# Patient Record
Sex: Female | Born: 1945 | Race: Black or African American | Hispanic: No | Marital: Married | Smoking: Never smoker
Health system: Southern US, Community
[De-identification: ages and names within clinical notes are randomized; demographics above are authoritative.]

## PROBLEM LIST (undated history)

## (undated) ENCOUNTER — Inpatient Hospital Stay: Admission: EM | Payer: Medicare HMO | Source: Home / Self Care

## (undated) DIAGNOSIS — M1990 Unspecified osteoarthritis, unspecified site: Secondary | ICD-10-CM

## (undated) DIAGNOSIS — Z860100 Personal history of colon polyps, unspecified: Secondary | ICD-10-CM

## (undated) DIAGNOSIS — K219 Gastro-esophageal reflux disease without esophagitis: Secondary | ICD-10-CM

## (undated) DIAGNOSIS — I10 Essential (primary) hypertension: Secondary | ICD-10-CM

## (undated) DIAGNOSIS — M2550 Pain in unspecified joint: Secondary | ICD-10-CM

## (undated) DIAGNOSIS — E785 Hyperlipidemia, unspecified: Secondary | ICD-10-CM

## (undated) HISTORY — PX: ABDOMINAL HYSTERECTOMY: SHX81

## (undated) HISTORY — PX: OTHER SURGICAL HISTORY: SHX169

## (undated) HISTORY — PX: JOINT REPLACEMENT: SHX530

## (undated) HISTORY — PX: TOTAL HIP ARTHROPLASTY: SHX124

## (undated) HISTORY — PX: TOTAL SHOULDER ARTHROPLASTY: SHX126

---

## 1997-11-03 ENCOUNTER — Emergency Department (HOSPITAL_COMMUNITY): Admission: EM | Admit: 1997-11-03 | Discharge: 1997-11-03 | Payer: Self-pay

## 1997-11-03 ENCOUNTER — Encounter: Payer: Self-pay | Admitting: Emergency Medicine

## 1998-10-24 ENCOUNTER — Encounter: Admission: RE | Admit: 1998-10-24 | Discharge: 1998-10-24 | Payer: Self-pay | Admitting: Orthopedic Surgery

## 1998-12-04 ENCOUNTER — Inpatient Hospital Stay (HOSPITAL_COMMUNITY): Admission: RE | Admit: 1998-12-04 | Discharge: 1998-12-12 | Payer: Self-pay | Admitting: Orthopedic Surgery

## 1998-12-04 ENCOUNTER — Encounter: Payer: Self-pay | Admitting: Orthopedic Surgery

## 1998-12-07 ENCOUNTER — Encounter: Payer: Self-pay | Admitting: Internal Medicine

## 1999-03-20 ENCOUNTER — Ambulatory Visit (HOSPITAL_COMMUNITY): Admission: RE | Admit: 1999-03-20 | Discharge: 1999-03-20 | Payer: Self-pay | Admitting: Orthopedic Surgery

## 1999-03-20 ENCOUNTER — Encounter: Payer: Self-pay | Admitting: Orthopedic Surgery

## 2002-10-02 ENCOUNTER — Ambulatory Visit (HOSPITAL_COMMUNITY): Admission: RE | Admit: 2002-10-02 | Discharge: 2002-10-02 | Payer: Self-pay | Admitting: Family Medicine

## 2002-10-02 ENCOUNTER — Encounter: Payer: Self-pay | Admitting: Family Medicine

## 2003-10-04 ENCOUNTER — Ambulatory Visit (HOSPITAL_COMMUNITY): Admission: RE | Admit: 2003-10-04 | Discharge: 2003-10-04 | Payer: Self-pay | Admitting: Family Medicine

## 2004-01-07 ENCOUNTER — Emergency Department (HOSPITAL_COMMUNITY): Admission: EM | Admit: 2004-01-07 | Discharge: 2004-01-07 | Payer: Self-pay | Admitting: Family Medicine

## 2004-01-19 ENCOUNTER — Emergency Department (HOSPITAL_COMMUNITY): Admission: EM | Admit: 2004-01-19 | Discharge: 2004-01-19 | Payer: Self-pay | Admitting: Emergency Medicine

## 2004-01-21 ENCOUNTER — Emergency Department (HOSPITAL_COMMUNITY): Admission: EM | Admit: 2004-01-21 | Discharge: 2004-01-21 | Payer: Self-pay | Admitting: Family Medicine

## 2004-01-28 ENCOUNTER — Encounter: Admission: RE | Admit: 2004-01-28 | Discharge: 2004-01-28 | Payer: Self-pay | Admitting: Orthopedic Surgery

## 2004-02-01 ENCOUNTER — Encounter: Admission: RE | Admit: 2004-02-01 | Discharge: 2004-02-01 | Payer: Self-pay | Admitting: Orthopedic Surgery

## 2004-02-08 ENCOUNTER — Encounter (INDEPENDENT_AMBULATORY_CARE_PROVIDER_SITE_OTHER): Payer: Self-pay | Admitting: *Deleted

## 2004-02-08 ENCOUNTER — Inpatient Hospital Stay (HOSPITAL_COMMUNITY): Admission: RE | Admit: 2004-02-08 | Discharge: 2004-02-12 | Payer: Self-pay | Admitting: Orthopedic Surgery

## 2004-12-01 ENCOUNTER — Ambulatory Visit (HOSPITAL_COMMUNITY): Admission: RE | Admit: 2004-12-01 | Discharge: 2004-12-01 | Payer: Self-pay | Admitting: Family Medicine

## 2005-08-25 ENCOUNTER — Encounter: Admission: RE | Admit: 2005-08-25 | Discharge: 2005-08-25 | Payer: Self-pay | Admitting: Orthopedic Surgery

## 2005-12-15 ENCOUNTER — Ambulatory Visit (HOSPITAL_COMMUNITY): Admission: RE | Admit: 2005-12-15 | Discharge: 2005-12-15 | Payer: Self-pay | Admitting: Family Medicine

## 2006-02-27 ENCOUNTER — Encounter: Admission: RE | Admit: 2006-02-27 | Discharge: 2006-02-27 | Payer: Self-pay | Admitting: Orthopedic Surgery

## 2007-10-14 ENCOUNTER — Ambulatory Visit (HOSPITAL_COMMUNITY): Admission: RE | Admit: 2007-10-14 | Discharge: 2007-10-14 | Payer: Self-pay | Admitting: Family Medicine

## 2009-03-06 ENCOUNTER — Ambulatory Visit (HOSPITAL_COMMUNITY): Admission: RE | Admit: 2009-03-06 | Discharge: 2009-03-06 | Payer: Self-pay | Admitting: Family Medicine

## 2010-05-30 NOTE — Op Note (Signed)
NAME:  Rhonda Hart, Rhonda Hart NO.:  [id number]   MEDICAL RECORD NO.:  [id number]          PATIENT TYPE:  INP   LOCATION:  NA                           FACILITY:  [facility]   PHYSICIAN:  Thera Flake., M.D.DATE OF BIRTH:  [birth year]   DATE OF PROCEDURE:  02/08/2004  DATE OF DISCHARGE:                                 OPERATIVE REPORT   INDICATION:  This is a 65 year old who has a history of bilateral total  hips, with the right being put in approximately 10 years ago.  She had done  well and actually was lost to followup and presented to the office with  acute hip pain.  Sedimentation rate was in the 50s, but aspiration was  negative, bone scan negative.  X-rays revealed extensive poly wear of the  acetabulum, consistent with early polyethylene loss and she was counseled  that this would require acetabular revision with cup exchange and a  placement of a new femoral head.   PREOPERATIVE DIAGNOSIS:  Polyethylene wear, right acetabulum.   POSTOPERATIVE DIAGNOSIS:  Polyethylene wear, right acetabulum.   OPERATION:  Right acetabular revision with 50-mm Marathon cup with new 28-mm  hip ball with +0 neck length.   SURGEON:  Dyke Brackett, M.D.   ASSISTANT:  Jamelle Rushing, P.A.   BLOOD LOSS:  Approximately 150.   DESCRIPTION OF PROCEDURE:  After sterile prep and drape in lateral position,  a posterior approach to the hip made with careful protection of the sciatic  nerve.  Dissection was carried on up from the anterior aspect of the  acetabular quadrant, relative to being really straight superior, with care  being made not to go posteriorly.  There was a large amount of reaction, but  frozen section revealed less than 5 white cells per high-power field with no  organisms seen.  Capsulectomy was carried out to allow exposure of the hip,  particularly the acetabulum.  The cup was removed with the cup extractor and  the locking ring removed.  A Sector cup was firmly porous  ingrown.  Copious  irrigation was carried out.  Cultures were sent as well from the fluid  around the hip, which was not excessive.  Again, capsulectomy carried out  followed by placement of the Marathon cup.  Really, the hip could not be  dislocated at any position tested, although had a tendency at the most  extreme angle of flexion, internal rotation and adduction to subluxate  slightly, but this was deemed to be an excellent amount of stability with  good maintenance of the leg lengths relatively to a +0 neck length being  removed and a +0 placed back on a 28-mm hip ball.  Again, the femur appeared  solidly ingrown as well.  Closure was effected with #1 Ethibond on the  fascia, which had been split, 2-0 Vicryl, skin clips, Marcaine with  epinephrine in the wound, lightly compressive sterile dressing applied.    WDC/MEDQ  D:  02/08/2004  T:  02/08/2004  Job:  [id number]

## 2010-05-30 NOTE — Op Note (Signed)
[place]  Patient:    Rhonda Hart                    MRN: [id number] Proc. Date: 12/04/98 Adm. Date:  [id number] Attending:  Teena Dunk                           Operative Report  PREOPERATIVE DIAGNOSIS:  Vascular necrosis left hip.  POSTOPERATIVE DIAGNOSIS:  Vasculare necrosis left hip.  OPERATION:  Left total hip replacement (Depuy AML 15 mm stem with 28 mm +1.5 mm  neck with 50 mm dual-lock Enduron acetabulum).  SURGEON:  Sharlot Gowda., M.D.  ASSISTANTS: 1. Jerolyn Shin. Tresa Res, M.D. 2. Arnoldo Morale, P.A.  ANESTHESIA:  General.  ESTIMATED BLOOD LOSS:  Blood loss approximately 300 cc.  DESCRIPTION OF PROCEDURE:  The patient was sterilely prepped and draped in the lateral position after Foley catheter insertion.  The hip was approached with a  posterior approach to the hip.  Careful preservation of the soft tissue. Careful protection of the sciatic nerve.  The gluteus maximus fascia and the iliotibial band was split.  Retractors were placed followed by posterior approach to the hip with a screwing in of the screwed external rotator and tagging it at the piriformis muscle.  Capsulotomy was carried out as well.  Head was delivered out the acetabular.  Progressive reaming carried up to 14.5 mm to accept the 15 mm prosthesis, followed by cutting of the head about one fingerbreadth above the lesser trochanter.  Progressive rasping up to the 15 mm diameter rasp followed by removal of the rasp. Acetabulum was further exposed and one acetabular retractor was placed anteriorly and posteriorly.  Acetabulum was freed with a significant amount of debride.  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  The acetabulum was progressively reamed up about 45 to 50 degrees of adduction, 10 to 15 degrees of anteversion, to 49 mm to accept a 50 mm cup.  Trial cup  48 bottomed out and the 50 would not easily bottom out.  The final cup was inserted in the same position followed by insertion of the trial liner.  The prosthesis was  sized using the broach and Prodigy-type stem versus an AML and it was felt like the AML was optimal in terms that there was no tendency of anterior translocation which was present with the Prodigy and the hip was still extremely stable in flexion nd abduction internal rotation and essentially could not be dislocated even with the soft tissues opened.  The final prosthesis was inserted on the femur and the acetabulum.  An apex hole eliminator screw was placed as well.  Prior to this, the bony surfaces were irrigated.  The hip again was judged to be stable.  Closure was effected with a 2 Ethibond on the capsule.  A capsulotomy had been made retagging the piriformis nd a running #1 Vicryl, 2-0 Vicryl, and skin clips.  Marcaine with epinephrine in he skin.  Lightly compressive sterile dressing applied and knee immobilizer.  Taken to the recovery room in stable condition. DD:  12/04/98 TD:  12/06/98 Job: [id number]

## 2010-05-30 NOTE — Discharge Summary (Signed)
NAMEMarland Kitchen  SAHORY, NORDLING NO.:  [id number]   MEDICAL RECORD NO.:  [id number]          PATIENT TYPE:  INP   LOCATION:  5029                         FACILITY:  [facility]   PHYSICIAN:  Dyke Brackett, M.D.    DATE OF BIRTH:  1945/08/26   DATE OF ADMISSION:  02/08/2004  DATE OF DISCHARGE:  02/12/2004                                 DISCHARGE SUMMARY   ADMISSION DIAGNOSES:  1.  Right total hip replacement with possible centric wear of polyethylene      cup.  2.  Otherwise healthy female.   DISCHARGE DIAGNOSES:  1.  Status post right total hip arthroplasty revision with polyethylene      exchange and ball replacement.  2.  History of left total 63+-hip replacement in 2000.  3.  Acute blood loss anemia secondary to surgery, asymptomatic.   HISTORY OF PRESENT ILLNESS:  Ms. Dias was a 65 year old female with a  right total hip arthroplasty that was painful.  The patient underwent right  total hip arthroplasty in 1997 and left total hip replacement in 2000, left  hip doing well.  Right hip painful since December 2005.  Pain described as a  sudden onset throbbing pain that is constant in nature.  She gets very mild  relief with pain medications.  The pain does awaken her at night,  The  patient uses no assistive devices.  ESR is high in the 48s, C-RP high 1.5.  Bone scan negative.  Hip aspiration done.  No organism seen.  X-rays of the  right hip show possible centric wear of the polyethylene cup.  Therefore,  patient was hospitalized for polyethylene exchange for revision of the hip.   ALLERGIES:  COUMADIN causes hives.   MEDICATIONS:  Roxicet 5 mg one to two tablets daily.   SURGICAL PROCEDURE:  The patient was taken to the operating room on February 08, 2004, by Dr. Madelon Lips assisted by Jamelle Rushing, P.A.  The patient was  placed under general anesthesia and a right total hip arthroplasty with a  polyethylene exchange and a ball replacement was performed.  The  following  components were removed by his right acetabular revision with a 50 mm  Marathon cup with a new 22 mm hip ball, with a plus 0 neck length.  The  patient tolerated the procedure well and returned to the recovery room in  good stable condition.   CONSULTS:  The following consults were obtained while the patient was  hospitalized:  PT, OT case management.   HOSPITAL COURSE:  On postoperative day #2, the patient developed a  temperature max of 103.0 degrees Fahrenheit.  Otherwise, patient had no  complaints.  Blood cultures were ordered x2.  A UA with a culture and  sensitivity were ordered.  On postoperative day #3, temperature max 101.6,  patient with no complaints.  White count 12,300.  Abdominal exam revealed  mild extension in the left upper quadrant tenderness; therefore, it was felt  patient was possibly constipated.  Dulcolax suppository was ordered.  On  postoperative day #4, the patient's temperature max was 101.7.  White count  had trended down to 8800.  The patient was slightly anemic secondary to  surgery, hemoglobin 9.9, hematocrit 30.  Hypokalemia had resolved and  potassium was 3.7.  Mild hypokalemia.  The patient was otherwise doing very  well with physical therapy and pain well controlled, and wanting to be  discharged to home.  The patient remained afebrile throughout the remainder  of the day and was discharged late in the afternoon.   LABORATORIES:  Routine labs on admission - CBC:  White count 8.8, hemoglobin  12.6, hematocrit 37.8, platelets 284.  Coagulations on admission - PT 12.7,  INR 0.9, PTT 38.  Routine chemistries on admission - all values within  normal limits.  Hepatic enzymes on admission - all values within normal  limits.  UA on admission was negative.  Blood cultures were negative.  Gram  stain right hip showed rare WBCs, predominantly mononuclear.  No organisms  seen.  Anaerobic cultures from the right hip showed rare WBCs present.  Both  PMN  and mononuclear - no organisms seen.  ECG performed on February 06, 2004,  showed normal sinus rhythm with a heart rate of 78 beats per minute.  PR  interval 138 msec, PRT axis __________.  Chest x-ray performed on January 07, 2004 showed right upper lobe pneumonia worse than lower lobe pneumonia.   DISCHARGE INSTRUCTIONS:  Medications:  1.  Lovenox 40 mg subcu daily 10 a.m. for 10 days.  2.  Percocet 5/325 one to two tablets every 4-6 hours p.r.n. pain.   Activity:  Weightbearing as tolerated.  PT/home health with [place].   Diet:  No restrictions.   CONDITION ON DISCHARGE:  The patient was discharged home in good and stable  condition.   WOUND CARE:  The patient is to keep wound clean and dry and change dressing  daily.  May shower in two days if no drainage.  Call office at [phone] if  any signs of infection or temperature greater than 101.5.   FOLLOW-UP:  The patient needs follow-up with Dr. Madelon Lips in approximately 10  days from discharge.  The patient is to call [phone] for an appointment.      GC/MEDQ  D:  04/08/2004  T:  04/08/2004  Job:  [id number]

## 2010-05-30 NOTE — H&P (Signed)
NAME:  Rhonda Hart, Rhonda Hart NO.:  [id number]   MEDICAL RECORD NO.:  [id number]          PATIENT TYPE:  INP   LOCATION:  NA                           FACILITY:  [facility]   PHYSICIAN:  Dyke Brackett, M.D.    DATE OF BIRTH:  [birth year]   DATE OF ADMISSION:  DATE OF DISCHARGE:                                HISTORY & PHYSICAL   DATE OF ADMISSION:  February 08, 2004   CHIEF COMPLAINT:  Painful right total hip arthroplasty.   HISTORY OF PRESENT ILLNESS:  Rhonda Hart is a very pleasant 65 year old  African-American female with a right total hip arthroplasty that is painful.  The patient had a right total hip arthroplasty in 1997 and a left total hip  replacement in 2000.  The left hip is doing very well.  The right hip  painful since December 2005.  The pain described as a sudden onset of  throbbing pain.  The pain is constant with some mild relief with pain  medications.  The pain does awaken her at night.  She uses no assistive  devices.  ESR is high at 48, CERP high at 1.5, bone scan negative.  Hip  aspiration done, no organisms seen.   X-RAYS:  Right hip shows possible eccentric wear of the poly cup.   ALLERGIES:  COUMADIN causes hives.   MEDICATIONS:  Roxicet 5 mg one to two daily.   PAST MEDICAL HISTORY:  The patient denies any cardiac, GI, GU, respiratory,  diabetes.   PAST SURGICAL HISTORY:  Right hip in 1997, left hip replacement in 2000.   The patient denies any complications or receiving any blood products with  either of the above procedures.  The patient smokes a half a pack of  cigarettes a day and has done so for at least 10 years.  She denies any  alcohol.  She is married with two grown children.  Primary care physician is  Dr. Rozanna Box.   The patient lives in a one-story home with two steps to the usual entrance.  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   FAMILY HISTORY:  The patient's mother deceased, age 49 or 29,  had  hypertension and stroke.  Father deceased age 32, had cardiac disease and  diabetes.  She has one living brother who is age 73 and healthy.  She has a  sister who is age 39 and has hypertension.   REVIEW OF SYSTEMS:  Reveals pneumonia 1 month ago.  However, this is  resolved.  She denies any recent cold, coughing, or flu-like symptoms.  She  denies any chest pain, PND, orthopnea.  She denies any GI, GU, hepatologic,  or hematologic conditions.  Review of systems otherwise negative.   PHYSICAL EXAMINATION:  GENERAL:  The patient is a well-developed, well-  nourished female in no acute distress.  The patient ambulates without any  assistive devices.  The patient is awake, alert, and appropriate.  She talks  easily with the examiner.  The patient's height is 5 feet 1 inch, 130  pounds.  VITAL SIGNS:  Temperature is 98.6, blood pressure is 132/80, pulse 74,  respiratory rate 16.  HEENT:  Head is normocephalic, atraumatic, without frontal or maxillary  sinus tenderness to palpation.  Conjunctivae are pink, sclerae are  nonicteric. PERRLA.  EOMs are intact.  There is no visible external ear  deformities.  Left TM is pearly and gray.  Right TM is occluded due to  cerumen.  Nose and nasal septum midline, nasal mucosa is pink and moist  without polyps.  Buccal mucosa pink and moist.  The patient has dentures on  the top and bottom jawline.  Oropharynx is without erythema or exudate,  tongue and uvula midline.  CARDIAC:  Regular rate and rhythm, no murmurs, rubs, or gallops noted.  LUNGS:  Clear to auscultation bilaterally.  No wheezing, rhonchi, or rales.  ABDOMEN:  Soft, nontender, bowel sounds x4 quadrants.  NECK:  Trachea is midline.  No lymphadenopathy.  Cervical spine full range  of motion without pain.  Palpation along the cervical column reveals no  tenderness.  Carotids are 2+ without bruits.  BACK:  Nontender to palpation over the thoracic and lumbar spine.  BREAST, GENITALIA,  URINARY, AND RECTAL:  All deferred at this time.  NEUROLOGIC:  The patient is alert and oriented x3.  Cranial nerves II-XII  are grossly intact.  Deep tendon reflexes knees 1+, symmetric; ankle jerks  are absent symmetrically.  Lower extremity strength testing reveals 5/5  strength against resistance throughout.  MUSCULOSKELETAL:  Upper extremities are symmetric in size and shape.  She  has full range of motion of the shoulders, elbows, wrists, hands.  Radial  pulses are 2+ bilaterally.  Lower extremities bilateral hips:  Right hip  full range of motion without pain.  Left hip flexion beyond 90 degrees  causes pain.  External-internal rotation 20 and 25 degrees respectively with  pain beyond those ranges.  Knees bilaterally:  Full range of motion.  Lower  extremities nonedematous. Dorsal and pedal pulses are 2+ bilaterally.   X-RAYS:  X-rays of the right hip show possible eccentric wear of the poly  cup.   IMPRESSION:  1.  Right total hip replacement pain with possible eccentric wear of poly      cup.  2.  Otherwise healthy female.   PLAN:  The patient is to be admitted to [facility] on February 08, 2003 to undergo a right total hip revision with acetabular revision and poly  exchange.  The patient will undergo all preoperative labs and procedures  prior to surgery.      GC/MEDQ  D:  02/04/2004  T:  02/04/2004  Job:  [id number]

## 2010-07-29 ENCOUNTER — Inpatient Hospital Stay (INDEPENDENT_AMBULATORY_CARE_PROVIDER_SITE_OTHER)
Admission: RE | Admit: 2010-07-29 | Discharge: 2010-07-29 | Disposition: A | Discharge status: Home / Self Care | Payer: 59 | Source: Ambulatory Visit | Attending: Family Medicine | Admitting: Family Medicine

## 2010-07-29 DIAGNOSIS — R002 Palpitations: Secondary | ICD-10-CM

## 2010-08-04 ENCOUNTER — Other Ambulatory Visit (HOSPITAL_COMMUNITY): Payer: Self-pay | Admitting: Cardiology

## 2010-08-20 ENCOUNTER — Ambulatory Visit (HOSPITAL_COMMUNITY)
Admission: RE | Admit: 2010-08-20 | Discharge: 2010-08-20 | Disposition: A | Discharge status: Home / Self Care | Payer: 59 | Source: Ambulatory Visit | Attending: Cardiology | Admitting: Cardiology

## 2010-08-20 ENCOUNTER — Encounter (HOSPITAL_COMMUNITY)
Admission: RE | Admit: 2010-08-20 | Discharge: 2010-08-20 | Disposition: A | Discharge status: Home / Self Care | Payer: 59 | Source: Ambulatory Visit | Attending: Cardiology | Admitting: Cardiology

## 2010-08-20 DIAGNOSIS — R9439 Abnormal result of other cardiovascular function study: Secondary | ICD-10-CM | POA: Insufficient documentation

## 2010-08-20 DIAGNOSIS — Z8249 Family history of ischemic heart disease and other diseases of the circulatory system: Secondary | ICD-10-CM | POA: Insufficient documentation

## 2010-08-20 DIAGNOSIS — R079 Chest pain, unspecified: Secondary | ICD-10-CM | POA: Insufficient documentation

## 2010-08-20 DIAGNOSIS — I10 Essential (primary) hypertension: Secondary | ICD-10-CM | POA: Insufficient documentation

## 2010-08-20 DIAGNOSIS — R011 Cardiac murmur, unspecified: Secondary | ICD-10-CM | POA: Insufficient documentation

## 2010-08-20 DIAGNOSIS — R51 Headache: Secondary | ICD-10-CM | POA: Insufficient documentation

## 2010-08-20 MED ORDER — TECHNETIUM TC 99M TETROFOSMIN IV KIT
10.0000 | PACK | Freq: Once | INTRAVENOUS | Status: AC | PRN
  Administered 2010-08-20: 10 via INTRAVENOUS

## 2010-08-20 MED ORDER — TECHNETIUM TC 99M TETROFOSMIN IV KIT
30.0000 | PACK | Freq: Once | INTRAVENOUS | Status: AC | PRN
  Administered 2010-08-20: 30 via INTRAVENOUS

## 2010-11-06 ENCOUNTER — Other Ambulatory Visit (HOSPITAL_COMMUNITY): Payer: Self-pay | Admitting: Cardiology

## 2010-11-06 DIAGNOSIS — Z1231 Encounter for screening mammogram for malignant neoplasm of breast: Secondary | ICD-10-CM

## 2010-12-02 ENCOUNTER — Ambulatory Visit (HOSPITAL_COMMUNITY)
Admission: RE | Admit: 2010-12-02 | Discharge: 2010-12-02 | Disposition: A | Discharge status: Home / Self Care | Payer: 59 | Source: Ambulatory Visit | Attending: Cardiology | Admitting: Cardiology

## 2010-12-02 DIAGNOSIS — Z1231 Encounter for screening mammogram for malignant neoplasm of breast: Secondary | ICD-10-CM | POA: Insufficient documentation

## 2011-11-10 ENCOUNTER — Other Ambulatory Visit (HOSPITAL_COMMUNITY): Payer: Self-pay | Admitting: Cardiology

## 2011-11-10 DIAGNOSIS — Z1231 Encounter for screening mammogram for malignant neoplasm of breast: Secondary | ICD-10-CM

## 2011-12-03 ENCOUNTER — Ambulatory Visit (HOSPITAL_COMMUNITY)
Admission: RE | Admit: 2011-12-03 | Discharge: 2011-12-03 | Disposition: A | Discharge status: Home / Self Care | Payer: Medicare Other | Source: Ambulatory Visit | Attending: Cardiology | Admitting: Cardiology

## 2011-12-03 DIAGNOSIS — Z1231 Encounter for screening mammogram for malignant neoplasm of breast: Secondary | ICD-10-CM | POA: Insufficient documentation

## 2011-12-15 ENCOUNTER — Ambulatory Visit
Admission: RE | Admit: 2011-12-15 | Discharge: 2011-12-15 | Disposition: A | Discharge status: Home / Self Care | Payer: 59 | Source: Ambulatory Visit | Attending: Cardiovascular Disease | Admitting: Cardiovascular Disease

## 2011-12-15 ENCOUNTER — Other Ambulatory Visit: Payer: Self-pay | Admitting: Cardiovascular Disease

## 2011-12-15 DIAGNOSIS — R05 Cough: Secondary | ICD-10-CM

## 2012-06-25 ENCOUNTER — Emergency Department (INDEPENDENT_AMBULATORY_CARE_PROVIDER_SITE_OTHER)
Admission: EM | Admit: 2012-06-25 | Discharge: 2012-06-25 | Disposition: A | Discharge status: Home / Self Care | Payer: Medicare Other | Source: Home / Self Care

## 2012-06-25 ENCOUNTER — Encounter (HOSPITAL_COMMUNITY): Payer: Self-pay | Admitting: Emergency Medicine

## 2012-06-25 DIAGNOSIS — M109 Gout, unspecified: Secondary | ICD-10-CM

## 2012-06-25 HISTORY — DX: Essential (primary) hypertension: I10

## 2012-06-25 MED ORDER — INDOMETHACIN 25 MG PO CAPS: 25.0000 mg | ORAL_CAPSULE | Freq: Three times a day (TID) | ORAL | Status: DC | PRN

## 2012-06-25 NOTE — ED Provider Notes (Signed)
History     CSN: [id number]  Arrival date & time 06/25/12  1229   First MD Initiated Contact with Patient 06/25/12 1331      Chief Complaint  Patient presents with  . Foot Pain    (Consider location/radiation/quality/duration/timing/severity/associated sxs/prior treatment) HPI Comments: Sudden onset of warmth and swelling and pain in right forefoot on the top.  No injury.  No constiutional sx.  No pain prior to yesterday.  No tx tried.  See medical, social hx.    Patient is a 67 y.o. female presenting with lower extremity pain. The history is provided by the patient.  Foot Pain This is a new problem. The current episode started 12 to 24 hours ago. Pertinent negatives include no chest pain, no abdominal pain, no headaches and no shortness of breath.    Past Medical History  Diagnosis Date  . Hypertension     Past Surgical History  Procedure Laterality Date  . Abdominal hysterectomy      No family history on file.  History  Substance Use Topics  . Smoking status: Current Some Day Smoker  . Smokeless tobacco: Not on file  . Alcohol Use: No    OB History   Grav Para Term Preterm Abortions TAB SAB Ect Mult Living                  Review of Systems  Respiratory: Negative for shortness of breath.   Cardiovascular: Negative for chest pain.  Gastrointestinal: Negative for abdominal pain.  Neurological: Negative for headaches.  All other systems reviewed and are negative.    Allergies  Coumadin  Home Medications   Current Outpatient Rx  Name  Route  Sig  Dispense  Refill  . Furosemide (LASIX PO)   Oral   Take by mouth.         . indomethacin (INDOCIN) 25 MG capsule   Oral   Take 1 capsule (25 mg total) by mouth 3 (three) times daily as needed.   30 capsule   0   . LISINOPRIL PO   Oral   Take by mouth.           BP 149/73  Pulse 71  Temp(Src) 98.4 F (36.9 C) (Oral)  Resp 18  SpO2 100%  Physical Exam  Vitals reviewed. Constitutional:  She is oriented to person, place, and time. She appears well-developed and well-nourished.  HENT:  Head: Normocephalic and atraumatic.  Eyes: Conjunctivae are normal. Pupils are equal, round, and reactive to light.  Neck: Normal range of motion. Neck supple.  Musculoskeletal: Normal range of motion.  Neurological: She is alert and oriented to person, place, and time.  Skin: Skin is warm and dry.  Right dorasal forefoot with erythema and edema and ttp.  Otherwise FROM , distal pulses, sens and reflexes wnl.    ED Course  Procedures (including critical care time)  Labs Reviewed - No data to display No results found.   1. Gout       MDM  See chart for meds and instruction.  Discussed dx and need for f/u and monitoring.  SE and precautions of meds reviewed.        Maryelizabeth Rowan, MD 06/25/12 1353

## 2012-06-25 NOTE — ED Notes (Signed)
Pt c/o right foot/metatarsal pain onset yest... sxs include: swelling, pain that increases w/movement Denies: inj/trauma to site, fevers. Pt ambulated well to exam room... She is alert and oriented w/no signs of acute distress.

## 2013-03-16 ENCOUNTER — Ambulatory Visit
Admission: RE | Admit: 2013-03-16 | Discharge: 2013-03-16 | Disposition: A | Discharge status: Home / Self Care | Payer: Commercial Managed Care - HMO | Source: Ambulatory Visit | Attending: Cardiovascular Disease | Admitting: Cardiovascular Disease

## 2013-03-16 ENCOUNTER — Other Ambulatory Visit: Payer: Self-pay | Admitting: Cardiovascular Disease

## 2013-03-16 DIAGNOSIS — J449 Chronic obstructive pulmonary disease, unspecified: Secondary | ICD-10-CM

## 2013-03-16 DIAGNOSIS — M25519 Pain in unspecified shoulder: Secondary | ICD-10-CM

## 2013-03-26 ENCOUNTER — Emergency Department (HOSPITAL_COMMUNITY)
Admission: EM | Admit: 2013-03-26 | Discharge: 2013-03-26 | Disposition: A | Discharge status: Home / Self Care | Payer: Medicare HMO | Attending: Emergency Medicine | Admitting: Emergency Medicine

## 2013-03-26 ENCOUNTER — Encounter (HOSPITAL_COMMUNITY): Payer: Self-pay | Admitting: Emergency Medicine

## 2013-03-26 ENCOUNTER — Emergency Department (HOSPITAL_COMMUNITY): Payer: Medicare HMO

## 2013-03-26 DIAGNOSIS — I10 Essential (primary) hypertension: Secondary | ICD-10-CM

## 2013-03-26 DIAGNOSIS — F17200 Nicotine dependence, unspecified, uncomplicated: Secondary | ICD-10-CM | POA: Insufficient documentation

## 2013-03-26 DIAGNOSIS — R51 Headache: Secondary | ICD-10-CM | POA: Insufficient documentation

## 2013-03-26 DIAGNOSIS — R55 Syncope and collapse: Secondary | ICD-10-CM | POA: Insufficient documentation

## 2013-03-26 DIAGNOSIS — Z79899 Other long term (current) drug therapy: Secondary | ICD-10-CM | POA: Insufficient documentation

## 2013-03-26 DIAGNOSIS — Z888 Allergy status to other drugs, medicaments and biological substances status: Secondary | ICD-10-CM | POA: Insufficient documentation

## 2013-03-26 DIAGNOSIS — H538 Other visual disturbances: Secondary | ICD-10-CM | POA: Insufficient documentation

## 2013-03-26 DIAGNOSIS — R519 Headache, unspecified: Secondary | ICD-10-CM

## 2013-03-26 DIAGNOSIS — Z7982 Long term (current) use of aspirin: Secondary | ICD-10-CM | POA: Insufficient documentation

## 2013-03-26 LAB — APTT: APTT: 34 s (ref 24–37)

## 2013-03-26 LAB — I-STAT TROPONIN, ED: Troponin i, poc: 0 ng/mL (ref 0.00–0.08)

## 2013-03-26 LAB — COMPREHENSIVE METABOLIC PANEL
ALBUMIN: 3.7 g/dL (ref 3.5–5.2)
ALT: 13 U/L (ref 0–35)
AST: 14 U/L (ref 0–37)
Alkaline Phosphatase: 88 U/L (ref 39–117)
BILIRUBIN TOTAL: 0.3 mg/dL (ref 0.3–1.2)
BUN: 13 mg/dL (ref 6–23)
CHLORIDE: 106 meq/L (ref 96–112)
CO2: 22 meq/L (ref 19–32)
Calcium: 9.2 mg/dL (ref 8.4–10.5)
Creatinine, Ser: 0.67 mg/dL (ref 0.50–1.10)
GFR calc Af Amer: >90 mL/min (ref >90)
GFR, EST NON AFRICAN AMERICAN: 90 mL/min — AB (ref >90)
Glucose, Bld: 86 mg/dL (ref 70–99)
Potassium: 3.7 mEq/L (ref 3.7–5.3)
SODIUM: 142 meq/L (ref 137–147)
Total Protein: 7.4 g/dL (ref 6.0–8.3)

## 2013-03-26 LAB — CBC
HCT: 38.1 % (ref 36.0–46.0)
Hemoglobin: 12.6 g/dL (ref 12.0–15.0)
MCH: 30.1 pg (ref 26.0–34.0)
MCHC: 33.1 g/dL (ref 30.0–36.0)
MCV: 91.1 fL (ref 78.0–100.0)
PLATELETS: 287 10*3/uL (ref 150–400)
RBC: 4.18 MIL/uL (ref 3.87–5.11)
RDW: 13.7 % (ref 11.5–15.5)
WBC: 6.6 10*3/uL (ref 4.0–10.5)

## 2013-03-26 LAB — DIFFERENTIAL
BASOS ABS: 0 10*3/uL (ref 0.0–0.1)
BASOS PCT: 0 % (ref 0–1)
Eosinophils Absolute: 0 10*3/uL (ref 0.0–0.7)
Eosinophils Relative: 1 % (ref 0–5)
LYMPHS PCT: 37 % (ref 12–46)
Lymphs Abs: 2.4 10*3/uL (ref 0.7–4.0)
Monocytes Absolute: 0.4 10*3/uL (ref 0.1–1.0)
Monocytes Relative: 5 % (ref 3–12)
NEUTROS ABS: 3.7 10*3/uL (ref 1.7–7.7)
Neutrophils Relative %: 57 % (ref 43–77)

## 2013-03-26 LAB — CBG MONITORING, ED: GLUCOSE-CAPILLARY: 86 mg/dL (ref 70–99)

## 2013-03-26 LAB — PROTIME-INR
INR: 0.98 (ref 0.00–1.49)
PROTHROMBIN TIME: 12.8 s (ref 11.6–15.2)

## 2013-03-26 NOTE — ED Notes (Addendum)
Per pt sts that she was in church and started to have blurred vision, tingling in her hands and fingers and nausea. sts they checked her BP and was over 200 sys. sts she felt a little blurred vision at home but became worse after going to church. sts some dizziness at church and just didn't feel well.  sts she did take her BP meds this am. No other nero deficits noted.

## 2013-03-26 NOTE — ED Provider Notes (Signed)
CSN: [id number]     Arrival date & time 03/26/13  1212 History   First MD Initiated Contact with Patient 03/26/13 1243     Chief Complaint  Patient presents with  . Hypertension     (Consider location/radiation/quality/duration/timing/severity/associated sxs/prior Treatment) HPI Comments: Patient is a 68 year old female with history of hypertension. She woke this morning not feeling well with a headache and blurry vision. She went to church this morning and became lightheaded and nearly passed out. She had to sit down on the pew for several minutes until her sensation subsided. She never lost consciousness. According to her son, she has not taken her blood pressure medication in several days and she is hypertensive upon presentation. She denies any other complaints such as chest pain or shortness of breath.  Patient is a 68 y.o. female presenting with syncope. The history is provided by the patient.  Loss of Consciousness Episode history:  Single Most recent episode:  Today Timing:  Constant Progression:  Resolved Chronicity:  New Witnessed: yes   Relieved by:  Nothing Worsened by:  Nothing tried Ineffective treatments:  None tried   Past Medical History  Diagnosis Date  . Hypertension    Past Surgical History  Procedure Laterality Date  . Abdominal hysterectomy     History reviewed. No pertinent family history. History  Substance Use Topics  . Smoking status: Current Some Day Smoker  . Smokeless tobacco: Not on file  . Alcohol Use: No   OB History   Grav Para Term Preterm Abortions TAB SAB Ect Mult Living                 Review of Systems  Cardiovascular: Positive for syncope.  All other systems reviewed and are negative.      Allergies  Coumadin  Home Medications   Current Outpatient Rx  Name  Route  Sig  Dispense  Refill  . aspirin EC 81 MG tablet   Oral   Take 81 mg by mouth daily.         . hydrochlorothiazide (MICROZIDE) 12.5 MG capsule    Oral   Take 12.5 mg by mouth daily.         Marland Kitchen KLOR-CON M10 10 MEQ tablet   Oral   Take 10 mEq by mouth daily.         Marland Kitchen lisinopril (PRINIVIL,ZESTRIL) 10 MG tablet   Oral   Take 10 mg by mouth daily.         . metoprolol succinate (TOPROL-XL) 50 MG 24 hr tablet   Oral   Take 50 mg by mouth daily.         Marland Kitchen oxyCODONE (OXY IR/ROXICODONE) 5 MG immediate release tablet   Oral   Take 5 mg by mouth daily as needed for severe pain. For pain          BP 209/89  Pulse 70  Temp(Src) 98.3 F (36.8 C)  Resp 18  Wt 165 lb (74.844 kg)  SpO2 100% Physical Exam  Nursing note and vitals reviewed. Constitutional: She is oriented to person, place, and time. She appears well-developed and well-nourished. No distress.  HENT:  Head: Normocephalic and atraumatic.  Mouth/Throat: Oropharynx is clear and moist.  Eyes: EOM are normal. Pupils are equal, round, and reactive to light.  There is no papilledema on funduscopic examination  Neck: Normal range of motion. Neck supple.  Cardiovascular: Normal rate and regular rhythm.  Exam reveals no gallop and no friction rub.  No murmur heard. Pulmonary/Chest: Effort normal and breath sounds normal. No respiratory distress. She has no wheezes.  Abdominal: Soft. Bowel sounds are normal. She exhibits no distension. There is no tenderness.  Musculoskeletal: Normal range of motion. She exhibits no edema.  Neurological: She is alert and oriented to person, place, and time. No cranial nerve deficit. She exhibits normal muscle tone. Coordination normal.  Skin: Skin is warm and dry. She is not diaphoretic.    ED Course  Procedures (including critical care time) Labs Review Labs Reviewed  PROTIME-INR  APTT  CBC  DIFFERENTIAL  COMPREHENSIVE METABOLIC PANEL  CBG MONITORING, ED  I-STAT TROPOININ, ED   Imaging Review No results found.   EKG Interpretation   Date/Time:  Sunday March 26 2013 13:03:47 EDT Ventricular Rate:  60 PR Interval:   142 QRS Duration: 93 QT Interval:  404 QTC Calculation: 404 R Axis:   15 Text Interpretation:  Sinus rhythm Probable anteroseptal infarct, old  Confirmed by DELOS  MD, Melina Mosteller ([id number]) on 03/26/2013 1:18:02 PM      MDM   Final diagnoses:  None    Patient is a 68 year old female who presents with complaints of headache and blurry vision. She then became near syncopal at church. Workup today reveals unremarkable laboratory studies and head CT which is unremarkable. Her blood pressure was significantly elevated upon presentation. According to her son she has not taken this in several days. I suspect her blood pressure may be the issue and I have advised her to be certain to be compliant with her medications. I've identified no emergent pathology that requires hospitalization or emergent intervention and feel as though she is appropriate for discharge.    Veryl Speak, MD 03/26/13 [phone]

## 2013-03-26 NOTE — Discharge Instructions (Signed)
Continue your blood pressure medications as prescribed.  Return to the ER if your symptoms significantly worsen or change.   Arterial Hypertension Arterial hypertension (high blood pressure) is a condition of elevated pressure in your blood vessels. Hypertension over a long period of time is a risk factor for strokes, heart attacks, and heart failure. It is also the leading cause of kidney (renal) failure.  CAUSES   In Adults -- Over 90% of all hypertension has no known cause. This is called essential or primary hypertension. In the other 10% of people with hypertension, the increase in blood pressure is caused by another disorder. This is called secondary hypertension. Important causes of secondary hypertension are:  Heavy alcohol use.  Obstructive sleep apnea.  Hyperaldosterosim (Conn's syndrome).  Steroid use.  Chronic kidney failure.  Hyperparathyroidism.  Medications.  Renal artery stenosis.  Pheochromocytoma.  Cushing's disease.  Coarctation of the aorta.  Scleroderma renal crisis.  Licorice (in excessive amounts).  Drugs (cocaine, methamphetamine). Your caregiver can explain any items above that apply to you.  In Children -- Secondary hypertension is more common and should always be considered.  Pregnancy -- Few women of childbearing age have high blood pressure. However, up to 10% of them develop hypertension of pregnancy. Generally, this will not harm the woman. It may be a sign of 3 complications of pregnancy: preeclampsia, HELLP syndrome, and eclampsia. Follow up and control with medication is necessary. SYMPTOMS   This condition normally does not produce any noticeable symptoms. It is usually found during a routine exam.  Malignant hypertension is a late problem of high blood pressure. It may have the following symptoms:  Headaches.  Blurred vision.  End-organ damage (this means your kidneys, heart, lungs, and other organs are being  damaged).  Stressful situations can increase the blood pressure. If a person with normal blood pressure has their blood pressure go up while being seen by their caregiver, this is often termed "white coat hypertension." Its importance is not known. It may be related with eventually developing hypertension or complications of hypertension.  Hypertension is often confused with mental tension, stress, and anxiety. DIAGNOSIS  The diagnosis is made by 3 separate blood pressure measurements. They are taken at least 1 week apart from each other. If there is organ damage from hypertension, the diagnosis may be made without repeat measurements. Hypertension is usually identified by having blood pressure readings:  Above 140/90 mmHg measured in both arms, at 3 separate times, over a couple weeks.  Over 130/80 mmHg should be considered a risk factor and may require treatment in patients with diabetes. Blood pressure readings over 120/80 mmHg are called "pre-hypertension" even in non-diabetic patients. To get a true blood pressure measurement, use the following guidelines. Be aware of the factors that can alter blood pressure readings.  Take measurements at least 1 hour after caffeine.  Take measurements 30 minutes after smoking and without any stress. This is another reason to quit smoking  it raises your blood pressure.  Use a proper cuff size. Ask your caregiver if you are not sure about your cuff size.  Most home blood pressure cuffs are automatic. They will measure systolic and diastolic pressures. The systolic pressure is the pressure reading at the start of sounds. Diastolic pressure is the pressure at which the sounds disappear. If you are elderly, measure pressures in multiple postures. Try sitting, lying or standing.  Sit at rest for a minimum of 5 minutes before taking measurements.  You should not  be on any medications like decongestants. These are found in many cold medications.  Record  your blood pressure readings and review them with your caregiver. If you have hypertension:  Your caregiver may do tests to be sure you do not have secondary hypertension (see "causes" above).  Your caregiver may also look for signs of metabolic syndrome. This is also called Syndrome X or Insulin Resistance Syndrome. You may have this syndrome if you have type 2 diabetes, abdominal obesity, and abnormal blood lipids in addition to hypertension.  Your caregiver will take your medical and family history and perform a physical exam.  Diagnostic tests may include blood tests (for glucose, cholesterol, potassium, and kidney function), a urinalysis, or an EKG. Other tests may also be necessary depending on your condition. PREVENTION  There are important lifestyle issues that you can adopt to reduce your chance of developing hypertension:  Maintain a normal weight.  Limit the amount of salt (sodium) in your diet.  Exercise often.  Limit alcohol intake.  Get enough potassium in your diet. Discuss specific advice with your caregiver.  Follow a DASH diet (dietary approaches to stop hypertension). This diet is rich in fruits, vegetables, and low-fat dairy products, and avoids certain fats. PROGNOSIS  Essential hypertension cannot be cured. Lifestyle changes and medical treatment can lower blood pressure and reduce complications. The prognosis of secondary hypertension depends on the underlying cause. Many people whose hypertension is controlled with medicine or lifestyle changes can live a normal, healthy life.  RISKS AND COMPLICATIONS  While high blood pressure alone is not an illness, it often requires treatment due to its short- and long-term effects on many organs. Hypertension increases your risk for:  CVAs or strokes (cerebrovascular accident).  Heart failure due to chronically high blood pressure (hypertensive cardiomyopathy).  Heart attack (myocardial infarction).  Damage to the  retina (hypertensive retinopathy).  Kidney failure (hypertensive nephropathy). Your caregiver can explain list items above that apply to you. Treatment of hypertension can significantly reduce the risk of complications. TREATMENT   For overweight patients, weight loss and regular exercise are recommended. Physical fitness lowers blood pressure.  Mild hypertension is usually treated with diet and exercise. A diet rich in fruits and vegetables, fat-free dairy products, and foods low in fat and salt (sodium) can help lower blood pressure. Decreasing salt intake decreases blood pressure in a 1/3 of people.  Stop smoking if you are a smoker. The steps above are highly effective in reducing blood pressure. While these actions are easy to suggest, they are difficult to achieve. Most patients with moderate or severe hypertension end up requiring medications to bring their blood pressure down to a normal level. There are several classes of medications for treatment. Blood pressure pills (antihypertensives) will lower blood pressure by their different actions. Lowering the blood pressure by 10 mmHg may decrease the risk of complications by as much as 25%. The goal of treatment is effective blood pressure control. This will reduce your risk for complications. Your caregiver will help you determine the best treatment for you according to your lifestyle. What is excellent treatment for one person, may not be for you. HOME CARE INSTRUCTIONS   Do not smoke.  Follow the lifestyle changes outlined in the "Prevention" section.  If you are on medications, follow the directions carefully. Blood pressure medications must be taken as prescribed. Skipping doses reduces their benefit. It also puts you at risk for problems.  Follow up with your caregiver, as directed.  If  you are asked to monitor your blood pressure at home, follow the guidelines in the "Diagnosis" section above. SEEK MEDICAL CARE IF:   You think  you are having medication side effects.  You have recurrent headaches or lightheadedness.  You have swelling in your ankles.  You have trouble with your vision. SEEK IMMEDIATE MEDICAL CARE IF:   You have sudden onset of chest pain or pressure, difficulty breathing, or other symptoms of a heart attack.  You have a severe headache.  You have symptoms of a stroke (such as sudden weakness, difficulty speaking, difficulty walking). MAKE SURE YOU:   Understand these instructions.  Will watch your condition.  Will get help right away if you are not doing well or get worse. Document Released: 12/29/2004 Document Revised: 03/23/2011 Document Reviewed: 07/29/2006 [facility] Patient Information 2014 [place], [place]. Headaches, Frequently Asked Questions MIGRAINE HEADACHES Q: What is migraine? What causes it? How can I treat it? A: Generally, migraine headaches begin as a dull ache. Then they develop into a constant, throbbing, and pulsating pain. You may experience pain at the temples. You may experience pain at the front or back of one or both sides of the head. The pain is usually accompanied by a combination of:  Nausea.  Vomiting.  Sensitivity to light and noise. Some people (about 15%) experience an aura (see below) before an attack. The cause of migraine is believed to be chemical reactions in the brain. Treatment for migraine may include over-the-counter or prescription medications. It may also include self-help techniques. These include relaxation training and biofeedback.  Q: What is an aura? A: About 15% of people with migraine get an "aura". This is a sign of neurological symptoms that occur before a migraine headache. You may see wavy or jagged lines, dots, or flashing lights. You might experience tunnel vision or blind spots in one or both eyes. The aura can include visual or auditory hallucinations (something imagined). It may include disruptions in smell (such as strange odors),  taste or touch. Other symptoms include:  Numbness.  A "pins and needles" sensation.  Difficulty in recalling or speaking the correct word. These neurological events may last as long as 60 minutes. These symptoms will fade as the headache begins. Q: What is a trigger? A: Certain physical or environmental factors can lead to or "trigger" a migraine. These include:  Foods.  Hormonal changes.  Weather.  Stress. It is important to remember that triggers are different for everyone. To help prevent migraine attacks, you need to figure out which triggers affect you. Keep a headache diary. This is a good way to track triggers. The diary will help you talk to your healthcare professional about your condition. Q: Does weather affect migraines? A: Bright sunshine, hot, humid conditions, and drastic changes in barometric pressure may lead to, or "trigger," a migraine attack in some people. But studies have shown that weather does not act as a trigger for everyone with migraines. Q: What is the link between migraine and hormones? A: Hormones start and regulate many of your body's functions. Hormones keep your body in balance within a constantly changing environment. The levels of hormones in your body are unbalanced at times. Examples are during menstruation, pregnancy, or menopause. That can lead to a migraine attack. In fact, about three quarters of all women with migraine report that their attacks are related to the menstrual cycle.  Q: Is there an increased risk of stroke for migraine sufferers? A: The likelihood of a migraine  attack causing a stroke is very remote. That is not to say that migraine sufferers cannot have a stroke associated with their migraines. In persons under age 57, the most common associated factor for stroke is migraine headache. But over the course of a person's normal life span, the occurrence of migraine headache may actually be associated with a reduced risk of dying from  cerebrovascular disease due to stroke.  Q: What are acute medications for migraine? A: Acute medications are used to treat the pain of the headache after it has started. Examples over-the-counter medications, NSAIDs, ergots, and triptans.  Q: What are the triptans? A: Triptans are the newest class of abortive medications. They are specifically targeted to treat migraine. Triptans are vasoconstrictors. They moderate some chemical reactions in the brain. The triptans work on receptors in your brain. Triptans help to restore the balance of a neurotransmitter called serotonin. Fluctuations in levels of serotonin are thought to be a main cause of migraine.  Q: Are over-the-counter medications for migraine effective? A: Over-the-counter, or "OTC," medications may be effective in relieving mild to moderate pain and associated symptoms of migraine. But you should see your caregiver before beginning any treatment regimen for migraine.  Q: What are preventive medications for migraine? A: Preventive medications for migraine are sometimes referred to as "prophylactic" treatments. They are used to reduce the frequency, severity, and length of migraine attacks. Examples of preventive medications include antiepileptic medications, antidepressants, beta-blockers, calcium channel blockers, and NSAIDs (nonsteroidal anti-inflammatory drugs). Q: Why are anticonvulsants used to treat migraine? A: During the past few years, there has been an increased interest in antiepileptic drugs for the prevention of migraine. They are sometimes referred to as "anticonvulsants". Both epilepsy and migraine may be caused by similar reactions in the brain.  Q: Why are antidepressants used to treat migraine? A: Antidepressants are typically used to treat people with depression. They may reduce migraine frequency by regulating chemical levels, such as serotonin, in the brain.  Q: What alternative therapies are used to treat migraine? A: The  term "alternative therapies" is often used to describe treatments considered outside the scope of conventional Western medicine. Examples of alternative therapy include acupuncture, acupressure, and yoga. Another common alternative treatment is herbal therapy. Some herbs are believed to relieve headache pain. Always discuss alternative therapies with your caregiver before proceeding. Some herbal products contain arsenic and other toxins. TENSION HEADACHES Q: What is a tension-type headache? What causes it? How can I treat it? A: Tension-type headaches occur randomly. They are often the result of temporary stress, anxiety, fatigue, or anger. Symptoms include soreness in your temples, a tightening band-like sensation around your head (a "vice-like" ache). Symptoms can also include a pulling feeling, pressure sensations, and contracting head and neck muscles. The headache begins in your forehead, temples, or the back of your head and neck. Treatment for tension-type headache may include over-the-counter or prescription medications. Treatment may also include self-help techniques such as relaxation training and biofeedback. CLUSTER HEADACHES Q: What is a cluster headache? What causes it? How can I treat it? A: Cluster headache gets its name because the attacks come in groups. The pain arrives with little, if any, warning. It is usually on one side of the head. A tearing or bloodshot eye and a runny nose on the same side of the headache may also accompany the pain. Cluster headaches are believed to be caused by chemical reactions in the brain. They have been described as the most severe and intense  of any headache type. Treatment for cluster headache includes prescription medication and oxygen. SINUS HEADACHES Q: What is a sinus headache? What causes it? How can I treat it? A: When a cavity in the bones of the face and skull (a sinus) becomes inflamed, the inflammation will cause localized pain. This condition  is usually the result of an allergic reaction, a tumor, or an infection. If your headache is caused by a sinus blockage, such as an infection, you will probably have a fever. An x-ray will confirm a sinus blockage. Your caregiver's treatment might include antibiotics for the infection, as well as antihistamines or decongestants.  REBOUND HEADACHES Q: What is a rebound headache? What causes it? How can I treat it? A: A pattern of taking acute headache medications too often can lead to a condition known as "rebound headache." A pattern of taking too much headache medication includes taking it more than 2 days per week or in excessive amounts. That means more than the label or a caregiver advises. With rebound headaches, your medications not only stop relieving pain, they actually begin to cause headaches. Doctors treat rebound headache by tapering the medication that is being overused. Sometimes your caregiver will gradually substitute a different type of treatment or medication. Stopping may be a challenge. Regularly overusing a medication increases the potential for serious side effects. Consult a caregiver if you regularly use headache medications more than 2 days per week or more than the label advises. ADDITIONAL QUESTIONS AND ANSWERS Q: What is biofeedback? A: Biofeedback is a self-help treatment. Biofeedback uses special equipment to monitor your body's involuntary physical responses. Biofeedback monitors:  Breathing.  Pulse.  Heart rate.  Temperature.  Muscle tension.  Brain activity. Biofeedback helps you refine and perfect your relaxation exercises. You learn to control the physical responses that are related to stress. Once the technique has been mastered, you do not need the equipment any more. Q: Are headaches hereditary? A: Four out of five (80%) of people that suffer report a family history of migraine. Scientists are not sure if this is genetic or a family predisposition. Despite  the uncertainty, a child has a 50% chance of having migraine if one parent suffers. The child has a 75% chance if both parents suffer.  Q: Can children get headaches? A: By the time they reach high school, most young people have experienced some type of headache. Many safe and effective approaches or medications can prevent a headache from occurring or stop it after it has begun.  Q: What type of doctor should I see to diagnose and treat my headache? A: Start with your primary caregiver. Discuss his or her experience and approach to headaches. Discuss methods of classification, diagnosis, and treatment. Your caregiver may decide to recommend you to a headache specialist, depending upon your symptoms or other physical conditions. Having diabetes, allergies, etc., may require a more comprehensive and inclusive approach to your headache. The National [employer] will provide, upon request, a list of [facility] physician members in your state. Document Released: 03/21/2003 Document Revised: 03/23/2011 Document Reviewed: 08/29/2007 [facility] Patient Information 2014 [place], [place].

## 2013-04-24 ENCOUNTER — Other Ambulatory Visit (HOSPITAL_COMMUNITY): Payer: Self-pay | Admitting: Cardiovascular Disease

## 2013-04-24 DIAGNOSIS — R079 Chest pain, unspecified: Secondary | ICD-10-CM

## 2013-05-19 ENCOUNTER — Other Ambulatory Visit: Payer: Self-pay

## 2013-05-19 ENCOUNTER — Other Ambulatory Visit (HOSPITAL_COMMUNITY): Payer: Medicare HMO | Admitting: Internal Medicine

## 2013-05-19 ENCOUNTER — Encounter (HOSPITAL_COMMUNITY)
Admission: RE | Admit: 2013-05-19 | Discharge: 2013-05-19 | Disposition: A | Discharge status: Home / Self Care | Payer: Medicare HMO | Source: Ambulatory Visit | Attending: Cardiovascular Disease | Admitting: Cardiovascular Disease

## 2013-05-19 DIAGNOSIS — R079 Chest pain, unspecified: Secondary | ICD-10-CM | POA: Insufficient documentation

## 2013-05-19 DIAGNOSIS — Z1231 Encounter for screening mammogram for malignant neoplasm of breast: Secondary | ICD-10-CM

## 2013-05-19 MED ORDER — TECHNETIUM TC 99M SESTAMIBI GENERIC - CARDIOLITE
10.0000 | Freq: Once | INTRAVENOUS | Status: AC | PRN
  Administered 2013-05-19: 10 via INTRAVENOUS

## 2013-05-19 MED ORDER — TECHNETIUM TC 99M SESTAMIBI GENERIC - CARDIOLITE
30.0000 | Freq: Once | INTRAVENOUS | Status: AC | PRN
  Administered 2013-05-19: 30 via INTRAVENOUS

## 2013-05-19 MED ORDER — REGADENOSON 0.4 MG/5ML IV SOLN
0.4000 mg | Freq: Once | INTRAVENOUS | Status: AC
  Administered 2013-05-19: 0.4 mg via INTRAVENOUS

## 2013-05-19 MED ORDER — REGADENOSON 0.4 MG/5ML IV SOLN
INTRAVENOUS | Status: AC
  Filled 2013-05-19: qty 5

## 2013-05-26 ENCOUNTER — Ambulatory Visit (HOSPITAL_COMMUNITY)
Admission: RE | Admit: 2013-05-26 | Discharge: 2013-05-26 | Disposition: A | Discharge status: Home / Self Care | Payer: Medicare HMO | Source: Ambulatory Visit | Attending: Internal Medicine | Admitting: Internal Medicine

## 2013-05-26 DIAGNOSIS — Z1231 Encounter for screening mammogram for malignant neoplasm of breast: Secondary | ICD-10-CM | POA: Diagnosis present

## 2014-01-16 DIAGNOSIS — M1710 Unilateral primary osteoarthritis, unspecified knee: Secondary | ICD-10-CM | POA: Diagnosis not present

## 2014-01-16 DIAGNOSIS — M1990 Unspecified osteoarthritis, unspecified site: Secondary | ICD-10-CM | POA: Diagnosis not present

## 2014-01-16 DIAGNOSIS — M19049 Primary osteoarthritis, unspecified hand: Secondary | ICD-10-CM | POA: Diagnosis not present

## 2014-01-16 DIAGNOSIS — I10 Essential (primary) hypertension: Secondary | ICD-10-CM | POA: Diagnosis not present

## 2014-05-09 DIAGNOSIS — I10 Essential (primary) hypertension: Secondary | ICD-10-CM | POA: Diagnosis not present

## 2014-05-09 DIAGNOSIS — M2550 Pain in unspecified joint: Secondary | ICD-10-CM | POA: Diagnosis not present

## 2014-05-09 DIAGNOSIS — M1710 Unilateral primary osteoarthritis, unspecified knee: Secondary | ICD-10-CM | POA: Diagnosis not present

## 2014-05-09 DIAGNOSIS — R5383 Other fatigue: Secondary | ICD-10-CM | POA: Diagnosis not present

## 2014-05-09 DIAGNOSIS — E539 Vitamin B deficiency, unspecified: Secondary | ICD-10-CM | POA: Diagnosis not present

## 2014-05-09 DIAGNOSIS — M19049 Primary osteoarthritis, unspecified hand: Secondary | ICD-10-CM | POA: Diagnosis not present

## 2014-05-09 DIAGNOSIS — Z79899 Other long term (current) drug therapy: Secondary | ICD-10-CM | POA: Diagnosis not present

## 2014-05-16 DIAGNOSIS — Z79899 Other long term (current) drug therapy: Secondary | ICD-10-CM | POA: Diagnosis not present

## 2014-05-16 DIAGNOSIS — R5383 Other fatigue: Secondary | ICD-10-CM | POA: Diagnosis not present

## 2014-05-16 DIAGNOSIS — M2550 Pain in unspecified joint: Secondary | ICD-10-CM | POA: Diagnosis not present

## 2014-05-23 DIAGNOSIS — Z23 Encounter for immunization: Secondary | ICD-10-CM | POA: Diagnosis not present

## 2014-05-23 DIAGNOSIS — M1710 Unilateral primary osteoarthritis, unspecified knee: Secondary | ICD-10-CM | POA: Diagnosis not present

## 2014-05-23 DIAGNOSIS — I10 Essential (primary) hypertension: Secondary | ICD-10-CM | POA: Diagnosis not present

## 2014-05-23 DIAGNOSIS — Z79899 Other long term (current) drug therapy: Secondary | ICD-10-CM | POA: Diagnosis not present

## 2014-07-02 DIAGNOSIS — K5900 Constipation, unspecified: Secondary | ICD-10-CM | POA: Diagnosis not present

## 2014-07-02 DIAGNOSIS — I10 Essential (primary) hypertension: Secondary | ICD-10-CM | POA: Diagnosis not present

## 2014-07-02 DIAGNOSIS — Z1211 Encounter for screening for malignant neoplasm of colon: Secondary | ICD-10-CM | POA: Diagnosis not present

## 2014-07-26 DIAGNOSIS — K635 Polyp of colon: Secondary | ICD-10-CM | POA: Diagnosis not present

## 2014-07-26 DIAGNOSIS — K6389 Other specified diseases of intestine: Secondary | ICD-10-CM | POA: Diagnosis not present

## 2014-07-26 DIAGNOSIS — D127 Benign neoplasm of rectosigmoid junction: Secondary | ICD-10-CM | POA: Diagnosis not present

## 2014-07-26 DIAGNOSIS — Z1211 Encounter for screening for malignant neoplasm of colon: Secondary | ICD-10-CM | POA: Diagnosis not present

## 2014-08-14 DIAGNOSIS — M1710 Unilateral primary osteoarthritis, unspecified knee: Secondary | ICD-10-CM | POA: Diagnosis not present

## 2014-08-14 DIAGNOSIS — I10 Essential (primary) hypertension: Secondary | ICD-10-CM | POA: Diagnosis not present

## 2014-08-14 DIAGNOSIS — F17290 Nicotine dependence, other tobacco product, uncomplicated: Secondary | ICD-10-CM | POA: Diagnosis not present

## 2014-08-30 DIAGNOSIS — M25552 Pain in left hip: Secondary | ICD-10-CM | POA: Diagnosis not present

## 2014-09-10 ENCOUNTER — Other Ambulatory Visit (HOSPITAL_COMMUNITY): Payer: Self-pay | Admitting: Physician Assistant

## 2014-09-10 DIAGNOSIS — M25452 Effusion, left hip: Secondary | ICD-10-CM | POA: Diagnosis not present

## 2014-09-10 DIAGNOSIS — M25552 Pain in left hip: Secondary | ICD-10-CM | POA: Diagnosis not present

## 2014-09-13 ENCOUNTER — Other Ambulatory Visit (HOSPITAL_COMMUNITY): Payer: Self-pay | Admitting: Orthopedic Surgery

## 2014-09-13 DIAGNOSIS — M25552 Pain in left hip: Secondary | ICD-10-CM

## 2014-09-14 ENCOUNTER — Ambulatory Visit (HOSPITAL_COMMUNITY): Payer: Commercial Managed Care - HMO

## 2014-09-18 ENCOUNTER — Ambulatory Visit (HOSPITAL_COMMUNITY)
Admission: RE | Admit: 2014-09-18 | Discharge: 2014-09-18 | Disposition: A | Discharge status: Home / Self Care | Payer: Commercial Managed Care - HMO | Source: Ambulatory Visit | Attending: Orthopedic Surgery | Admitting: Orthopedic Surgery

## 2014-09-18 ENCOUNTER — Encounter (HOSPITAL_COMMUNITY)
Admission: RE | Admit: 2014-09-18 | Discharge: 2014-09-18 | Disposition: A | Discharge status: Home / Self Care | Payer: Commercial Managed Care - HMO | Source: Ambulatory Visit | Attending: Orthopedic Surgery | Admitting: Orthopedic Surgery

## 2014-09-18 DIAGNOSIS — M25552 Pain in left hip: Secondary | ICD-10-CM | POA: Insufficient documentation

## 2014-09-18 DIAGNOSIS — Z96643 Presence of artificial hip joint, bilateral: Secondary | ICD-10-CM | POA: Insufficient documentation

## 2014-10-02 DIAGNOSIS — M25552 Pain in left hip: Secondary | ICD-10-CM | POA: Diagnosis not present

## 2014-10-11 DIAGNOSIS — M25552 Pain in left hip: Secondary | ICD-10-CM | POA: Diagnosis not present

## 2014-10-24 DIAGNOSIS — Z23 Encounter for immunization: Secondary | ICD-10-CM | POA: Diagnosis not present

## 2014-10-24 DIAGNOSIS — M25552 Pain in left hip: Secondary | ICD-10-CM | POA: Diagnosis not present

## 2014-10-24 DIAGNOSIS — F17290 Nicotine dependence, other tobacco product, uncomplicated: Secondary | ICD-10-CM | POA: Diagnosis not present

## 2014-10-24 DIAGNOSIS — I10 Essential (primary) hypertension: Secondary | ICD-10-CM | POA: Diagnosis not present

## 2014-11-08 ENCOUNTER — Ambulatory Visit: Payer: Self-pay | Admitting: Physician Assistant

## 2014-11-08 DIAGNOSIS — M25552 Pain in left hip: Secondary | ICD-10-CM | POA: Diagnosis not present

## 2014-11-08 NOTE — H&P (Signed)
TOTAL HIP REVISION ADMISSION H&P  Patient is admitted for left revision total hip arthroplasty.  Subjective:  Chief Complaint: left hip pain  HPI: Rhonda Hart, 69 y.o. female, has a history of pain and functional disability in the left hip due to arthritis and patient has failed non-surgical conservative treatments for greater than 12 weeks to include NSAID's and/or analgesics, use of assistive devices and activity modification. The indications for the revision total hip arthroplasty are loosening of one or more components and bearing surface wear leading to  implant or hip misalignment.  Onset of symptoms was gradual starting 2 years ago with gradually worsening course since that time.  Prior procedures on the left hip include arthroplasty.  Patient currently rates pain in the left hip at 10 out of 10 with activity.  There is night pain, worsening of pain with activity and weight bearing, pain that interfers with activities of daily living and pain with passive range of motion. 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  This condition presents safety issues increasing the risk of falls.  there is no current active infection.  There are no active problems to display for this patient.  Past Medical History  Diagnosis Date  . Hypertension     Past Surgical History  Procedure Laterality Date  . Abdominal hysterectomy       (Not in a hospital admission) Allergies  Allergen Reactions  . Coumadin [Warfarin Sodium] Rash    Social History  Substance Use Topics  . Smoking status: Current Some Day Smoker  . Smokeless tobacco: Not on file  . Alcohol Use: No    No family history on file.    Review of Systems  Musculoskeletal: Positive for joint pain.  All other systems reviewed and are negative.   Objective:  Physical Exam  Constitutional: She is oriented to person, place, and time. She appears well-developed and well-nourished. No distress.  HENT:  Head:  Normocephalic and atraumatic.  Nose: Nose normal.  Eyes: Conjunctivae and EOM are normal. Pupils are equal, round, and reactive to light.  Neck: Normal range of motion. Neck supple.  Cardiovascular: Normal rate, regular rhythm, normal heart sounds and intact distal pulses.   Respiratory: Effort normal and breath sounds normal. No respiratory distress. She has no wheezes.  GI: Soft. Bowel sounds are normal. She exhibits no distension. There is no tenderness.  Musculoskeletal:       Left hip: She exhibits decreased range of motion and tenderness.  Lymphadenopathy:    She has no cervical adenopathy.  Neurological: She is alert and oriented to person, place, and time. No cranial nerve deficit.  Skin: Skin is warm and dry. No rash noted. No erythema.  Psychiatric: She has a normal mood and affect. Her behavior is normal.    Vital signs in last 24 hours: @VSRANGES @   Labs:   There is no height on file to calculate BMI.  Imaging Review:  Plain radiographs demonstrate previous total hip degenerative joint disease of the left hip(s). There is evidence of loosening of the polywear.The bone quality appears to be good for age and reported activity level.   Assessment/Plan:  End stage arthritis, left hip(s) with failed previous arthroplasty.  The patient history, physical examination, clinical judgement of the provider and imaging studies are consistent with end stage degenerative joint disease of the left hip(s), previous total hip arthroplasty. Revision total hip arthroplasty is deemed medically necessary. The treatment options including medical management, injection therapy, arthroscopy and  arthroplasty were discussed at length. The risks and benefits of total hip arthroplasty were presented and reviewed. The risks due to aseptic loosening, infection, stiffness, dislocation/subluxation,  thromboembolic complications and other imponderables were discussed.  The patient acknowledged the  explanation, agreed to proceed with the plan and consent was signed. Patient is being admitted for inpatient treatment for surgery, pain control, PT, OT, prophylactic antibiotics, VTE prophylaxis, progressive ambulation and ADL's and discharge planning. The patient is planning to be discharged home with home health services

## 2014-11-20 ENCOUNTER — Encounter (HOSPITAL_COMMUNITY)
Admission: RE | Admit: 2014-11-20 | Discharge: 2014-11-20 | Disposition: A | Discharge status: Home / Self Care | Payer: Commercial Managed Care - HMO | Source: Ambulatory Visit | Attending: Orthopedic Surgery | Admitting: Orthopedic Surgery

## 2014-11-20 ENCOUNTER — Ambulatory Visit (HOSPITAL_COMMUNITY)
Admission: RE | Admit: 2014-11-20 | Discharge: 2014-11-20 | Disposition: A | Discharge status: Home / Self Care | Payer: Commercial Managed Care - HMO | Source: Ambulatory Visit | Attending: Physician Assistant | Admitting: Physician Assistant

## 2014-11-20 ENCOUNTER — Encounter (HOSPITAL_COMMUNITY): Payer: Self-pay

## 2014-11-20 DIAGNOSIS — R001 Bradycardia, unspecified: Secondary | ICD-10-CM | POA: Insufficient documentation

## 2014-11-20 DIAGNOSIS — I10 Essential (primary) hypertension: Secondary | ICD-10-CM | POA: Diagnosis not present

## 2014-11-20 DIAGNOSIS — E785 Hyperlipidemia, unspecified: Secondary | ICD-10-CM | POA: Diagnosis not present

## 2014-11-20 DIAGNOSIS — Z01810 Encounter for preprocedural cardiovascular examination: Secondary | ICD-10-CM | POA: Diagnosis not present

## 2014-11-20 DIAGNOSIS — Z96642 Presence of left artificial hip joint: Secondary | ICD-10-CM

## 2014-11-20 DIAGNOSIS — Z01812 Encounter for preprocedural laboratory examination: Secondary | ICD-10-CM | POA: Insufficient documentation

## 2014-11-20 DIAGNOSIS — Z01818 Encounter for other preprocedural examination: Secondary | ICD-10-CM | POA: Diagnosis not present

## 2014-11-20 HISTORY — DX: Unspecified osteoarthritis, unspecified site: M19.90

## 2014-11-20 HISTORY — DX: Personal history of colonic polyps: Z86.010

## 2014-11-20 HISTORY — DX: Pain in unspecified joint: M25.50

## 2014-11-20 HISTORY — DX: Personal history of colon polyps, unspecified: Z86.0100

## 2014-11-20 HISTORY — DX: Hyperlipidemia, unspecified: E78.5

## 2014-11-20 LAB — URINALYSIS, ROUTINE W REFLEX MICROSCOPIC
BILIRUBIN URINE: NEGATIVE
Glucose, UA: NEGATIVE mg/dL
Ketones, ur: NEGATIVE mg/dL
NITRITE: NEGATIVE
PROTEIN: NEGATIVE mg/dL
SPECIFIC GRAVITY, URINE: 1.011 (ref 1.005–1.030)
UROBILINOGEN UA: 0.2 mg/dL (ref 0.0–1.0)
pH: 6 (ref 5.0–8.0)

## 2014-11-20 LAB — PROTIME-INR
INR: 1.03 (ref 0.00–1.49)
PROTHROMBIN TIME: 13.7 s (ref 11.6–15.2)

## 2014-11-20 LAB — URINE MICROSCOPIC-ADD ON

## 2014-11-20 LAB — CBC WITH DIFFERENTIAL/PLATELET
BASOS ABS: 0 10*3/uL (ref 0.0–0.1)
BASOS PCT: 0 %
Eosinophils Absolute: 0 10*3/uL (ref 0.0–0.7)
Eosinophils Relative: 1 %
HEMATOCRIT: 37.3 % (ref 36.0–46.0)
HEMOGLOBIN: 12.3 g/dL (ref 12.0–15.0)
Lymphocytes Relative: 40 %
Lymphs Abs: 2.6 10*3/uL (ref 0.7–4.0)
MCH: 29.9 pg (ref 26.0–34.0)
MCHC: 33 g/dL (ref 30.0–36.0)
MCV: 90.8 fL (ref 78.0–100.0)
MONO ABS: 0.3 10*3/uL (ref 0.1–1.0)
Monocytes Relative: 5 %
NEUTROS ABS: 3.5 10*3/uL (ref 1.7–7.7)
NEUTROS PCT: 54 %
Platelets: 283 10*3/uL (ref 150–400)
RBC: 4.11 MIL/uL (ref 3.87–5.11)
RDW: 13.4 % (ref 11.5–15.5)
WBC: 6.4 10*3/uL (ref 4.0–10.5)

## 2014-11-20 LAB — COMPREHENSIVE METABOLIC PANEL
ALBUMIN: 3.8 g/dL (ref 3.5–5.0)
ALT: 18 U/L (ref 14–54)
AST: 18 U/L (ref 15–41)
Alkaline Phosphatase: 80 U/L (ref 38–126)
Anion gap: 10 (ref 5–15)
BILIRUBIN TOTAL: 0.5 mg/dL (ref 0.3–1.2)
BUN: 16 mg/dL (ref 6–20)
CALCIUM: 9.8 mg/dL (ref 8.9–10.3)
CHLORIDE: 105 mmol/L (ref 101–111)
CO2: 24 mmol/L (ref 22–32)
Creatinine, Ser: 0.85 mg/dL (ref 0.44–1.00)
GFR calc Af Amer: >60 mL/min (ref >60)
GFR calc non Af Amer: >60 mL/min (ref >60)
GLUCOSE: 102 mg/dL — AB (ref 65–99)
POTASSIUM: 3.9 mmol/L (ref 3.5–5.1)
Sodium: 139 mmol/L (ref 135–145)
TOTAL PROTEIN: 7.8 g/dL (ref 6.5–8.1)

## 2014-11-20 LAB — SURGICAL PCR SCREEN
MRSA, PCR: NEGATIVE
STAPHYLOCOCCUS AUREUS: NEGATIVE

## 2014-11-20 LAB — TYPE AND SCREEN
ABO/RH(D): A POS
ANTIBODY SCREEN: NEGATIVE

## 2014-11-20 LAB — APTT: APTT: 38 s — AB (ref 24–37)

## 2014-11-20 LAB — ABO/RH: ABO/RH(D): A POS

## 2014-11-20 MED ORDER — CHLORHEXIDINE GLUCONATE 4 % EX LIQD: 60.0000 mL | Freq: Once | CUTANEOUS | Status: DC

## 2014-11-20 NOTE — Pre-Procedure Instructions (Signed)
Rhonda Hart  11/20/2014      [facility] - [place] ([place]), [place] [address] [place] ([place]) [place] [address] Phone: [phone] Fax: [phone]    Your procedure is scheduled on Fri, Nov 18 @ 7:30 AM  Report to [facility] Admitting at 5:30 AM  Call this number if you have problems the morning of surgery:  [phone]   Remember:  Do not eat food or drink liquids after midnight.  Take these medicines the morning of surgery with A SIP OF WATER Metoprolol(Toprol)              Stop taking your Aspirin.No Goody's,BC's,Aleve,Ibuprofen,Motrin,Advil,Fish Oil,or any Herbal Medications.    Do not wear jewelry, make-up or nail polish.  Do not wear lotions, powders, or perfumes.  You may wear deodorant.  Do not shave 48 hours prior to surgery.    Do not bring valuables to the hospital.  [facility] is not responsible for any belongings or valuables.  Contacts, dentures or bridgework may not be worn into surgery.  Leave your suitcase in the car.  After surgery it may be brought to your room.  For patients admitted to the hospital, discharge time will be determined by your treatment team.  Patients discharged the day of surgery will not be allowed to drive home.    Special instructions:  [place] - Preparing for Surgery  Before surgery, you can play an important role.  Because skin is not sterile, your skin needs to be as free of germs as possible.  You can reduce the number of germs on you skin by washing with CHG (chlorahexidine gluconate) soap before surgery.  CHG is an antiseptic cleaner which kills germs and bonds with the skin to continue killing germs even after washing.  Please DO NOT use if you have an allergy to CHG or antibacterial soaps.  If your skin becomes reddened/irritated stop using the CHG and inform your nurse when you arrive at Short Stay.  Do not shave (including legs and underarms) for at least 48 hours  prior to the first CHG shower.  You may shave your face.  Please follow these instructions carefully:   1.  Shower with CHG Soap the night before surgery and the                                morning of Surgery.  2.  If you choose to wash your hair, wash your hair first as usual with your       normal shampoo.  3.  After you shampoo, rinse your hair and body thoroughly to remove the                      Shampoo.  4.  Use CHG as you would any other liquid soap.  You can apply chg directly       to the skin and wash gently with scrungie or a clean washcloth.  5.  Apply the CHG Soap to your body ONLY FROM THE NECK DOWN.        Do not use on open wounds or open sores.  Avoid contact with your eyes,       ears, mouth and genitals (private parts).  Wash genitals (private parts)       with your normal soap.  6.  Wash thoroughly, paying special  attention to the area where your surgery        will be performed.  7.  Thoroughly rinse your body with warm water from the neck down.  8.  DO NOT shower/wash with your normal soap after using and rinsing off       the CHG Soap.  9.  Pat yourself dry with a clean towel.            10.  Wear clean pajamas.            11.  Place clean sheets on your bed the night of your first shower and do not        sleep with pets.  Day of Surgery  Do not apply any lotions/deoderants the morning of surgery.  Please wear clean clothes to the hospital/surgery center.  [place] - Preparing for Surgery  Before surgery, you can play an important role.  Because skin is not sterile, your skin needs to be as free of germs as possible.  You can reduce the number of germs on you skin by washing with CHG (chlorahexidine gluconate) soap before surgery.  CHG is an antiseptic cleaner which kills germs and bonds with the skin to continue killing germs even after washing.  Please DO NOT use if you have an allergy to CHG or antibacterial soaps.  If your skin becomes reddened/irritated  stop using the CHG and inform your nurse when you arrive at Short Stay.  Do not shave (including legs and underarms) for at least 48 hours prior to the first CHG shower.  You may shave your face.  Please follow these instructions carefully:   1.  Shower with CHG Soap the night before surgery and the                                morning of Surgery.  2.  If you choose to wash your hair, wash your hair first as usual with your       normal shampoo.  3.  After you shampoo, rinse your hair and body thoroughly to remove the                      Shampoo.  4.  Use CHG as you would any other liquid soap.  You can apply chg directly       to the skin and wash gently with scrungie or a clean washcloth.  5.  Apply the CHG Soap to your body ONLY FROM THE NECK DOWN.        Do not use on open wounds or open sores.  Avoid contact with your eyes,       ears, mouth and genitals (private parts).  Wash genitals (private parts)       with your normal soap.  6.  Wash thoroughly, paying special attention to the area where your surgery        will be performed.  7.  Thoroughly rinse your body with warm water from the neck down.  8.  DO NOT shower/wash with your normal soap after using and rinsing off       the CHG Soap.  9.  Pat yourself dry with a clean towel.            10.  Wear clean pajamas.            11.  Place clean sheets on your  bed the night of your first shower and do not        sleep with pets.  Day of Surgery  Do not apply any lotions/deoderants the morning of surgery.  Please wear clean clothes to the hospital/surgery center.    Please read over the following fact sheets that you were given. Pain Booklet, Coughing and Deep Breathing, Blood Transfusion Information, MRSA Information and Surgical Site Infection Prevention

## 2014-11-20 NOTE — Progress Notes (Addendum)
Cardiologist is Dr.Kadakia with last visit a yr ago-to request   Medical Md is Dr.Elizabeth Ernie Hew  Echo thinks it was done last yr-to request report from Cardiologist  Stress test reports in epic from 2012/2015  Heart cath denies ever having one  EKG denies in past yr  CXR denies in past yr

## 2014-11-21 LAB — URINE CULTURE: Culture: >=100000

## 2014-11-29 MED ORDER — CEFAZOLIN SODIUM-DEXTROSE 2-3 GM-% IV SOLR
2.0000 g | INTRAVENOUS | Status: AC
  Administered 2014-11-30: 2 g via INTRAVENOUS
  Filled 2014-11-29: qty 50

## 2014-11-29 MED ORDER — TRANEXAMIC ACID 1000 MG/10ML IV SOLN
1000.0000 mg | INTRAVENOUS | Status: DC
  Filled 2014-11-29: qty 10

## 2014-11-29 MED ORDER — TRANEXAMIC ACID 1000 MG/10ML IV SOLN
1000.0000 mg | INTRAVENOUS | Status: AC
  Administered 2014-11-30: 1000 mg via INTRAVENOUS
  Filled 2014-11-29: qty 10

## 2014-11-30 ENCOUNTER — Inpatient Hospital Stay (HOSPITAL_COMMUNITY): Payer: Commercial Managed Care - HMO

## 2014-11-30 ENCOUNTER — Encounter (HOSPITAL_COMMUNITY)
Admission: RE | Disposition: A | Discharge status: Home Health | Payer: Self-pay | Source: Ambulatory Visit | Attending: Orthopedic Surgery

## 2014-11-30 ENCOUNTER — Inpatient Hospital Stay (HOSPITAL_COMMUNITY)
Admission: RE | Admit: 2014-11-30 | Discharge: 2014-12-02 | DRG: 467 | Disposition: A | Discharge status: Home Health | Payer: Commercial Managed Care - HMO | Source: Ambulatory Visit | Attending: Orthopedic Surgery | Admitting: Orthopedic Surgery

## 2014-11-30 ENCOUNTER — Inpatient Hospital Stay (HOSPITAL_COMMUNITY): Payer: Commercial Managed Care - HMO | Admitting: Anesthesiology

## 2014-11-30 ENCOUNTER — Encounter (HOSPITAL_COMMUNITY): Payer: Self-pay | Admitting: *Deleted

## 2014-11-30 DIAGNOSIS — E785 Hyperlipidemia, unspecified: Secondary | ICD-10-CM | POA: Diagnosis not present

## 2014-11-30 DIAGNOSIS — T84031A Mechanical loosening of internal left hip prosthetic joint, initial encounter: Secondary | ICD-10-CM | POA: Diagnosis not present

## 2014-11-30 DIAGNOSIS — Z96649 Presence of unspecified artificial hip joint: Secondary | ICD-10-CM

## 2014-11-30 DIAGNOSIS — D62 Acute posthemorrhagic anemia: Secondary | ICD-10-CM | POA: Diagnosis not present

## 2014-11-30 DIAGNOSIS — Z471 Aftercare following joint replacement surgery: Secondary | ICD-10-CM | POA: Diagnosis not present

## 2014-11-30 DIAGNOSIS — M1612 Unilateral primary osteoarthritis, left hip: Secondary | ICD-10-CM | POA: Diagnosis not present

## 2014-11-30 DIAGNOSIS — Y792 Prosthetic and other implants, materials and accessory orthopedic devices associated with adverse incidents: Secondary | ICD-10-CM | POA: Diagnosis present

## 2014-11-30 DIAGNOSIS — Z96642 Presence of left artificial hip joint: Secondary | ICD-10-CM | POA: Diagnosis not present

## 2014-11-30 DIAGNOSIS — F17200 Nicotine dependence, unspecified, uncomplicated: Secondary | ICD-10-CM | POA: Diagnosis not present

## 2014-11-30 DIAGNOSIS — I10 Essential (primary) hypertension: Secondary | ICD-10-CM | POA: Diagnosis not present

## 2014-11-30 DIAGNOSIS — T84039A Mechanical loosening of unspecified internal prosthetic joint, initial encounter: Secondary | ICD-10-CM | POA: Diagnosis not present

## 2014-11-30 DIAGNOSIS — M25552 Pain in left hip: Secondary | ICD-10-CM | POA: Diagnosis present

## 2014-11-30 HISTORY — PX: TOTAL HIP REVISION: SHX763

## 2014-11-30 SURGERY — TOTAL HIP REVISION
Anesthesia: General | Site: Hip | Laterality: Left

## 2014-11-30 MED ORDER — ACETAMINOPHEN 650 MG RE SUPP: 650.0000 mg | Freq: Four times a day (QID) | RECTAL | Status: DC | PRN

## 2014-11-30 MED ORDER — HYDROMORPHONE HCL 1 MG/ML IJ SOLN
1.0000 mg | INTRAMUSCULAR | Status: DC | PRN
  Administered 2014-11-30 – 2014-12-01 (×2): 1 mg via INTRAVENOUS
  Filled 2014-11-30 (×2): qty 1

## 2014-11-30 MED ORDER — BISACODYL 10 MG RE SUPP: 10.0000 mg | Freq: Every day | RECTAL | Status: DC | PRN

## 2014-11-30 MED ORDER — DEXAMETHASONE SODIUM PHOSPHATE 4 MG/ML IJ SOLN
INTRAMUSCULAR | Status: AC
  Filled 2014-11-30: qty 1

## 2014-11-30 MED ORDER — PHENOL 1.4 % MT LIQD: 1.0000 | OROMUCOSAL | Status: DC | PRN

## 2014-11-30 MED ORDER — GLYCOPYRROLATE 0.2 MG/ML IJ SOLN
INTRAMUSCULAR | Status: AC
  Filled 2014-11-30: qty 1

## 2014-11-30 MED ORDER — SUGAMMADEX SODIUM 200 MG/2ML IV SOLN
INTRAVENOUS | Status: AC
  Filled 2014-11-30: qty 2

## 2014-11-30 MED ORDER — ASPIRIN EC 81 MG PO TBEC: 81.0000 mg | DELAYED_RELEASE_TABLET | Freq: Every day | ORAL | Status: DC

## 2014-11-30 MED ORDER — FENTANYL CITRATE (PF) 100 MCG/2ML IJ SOLN
INTRAMUSCULAR | Status: DC | PRN
  Administered 2014-11-30: 50 ug via INTRAVENOUS
  Administered 2014-11-30: 150 ug via INTRAVENOUS
  Administered 2014-11-30: 100 ug via INTRAVENOUS

## 2014-11-30 MED ORDER — SODIUM CHLORIDE 0.9 % IR SOLN
Status: DC | PRN
Start: 2014-11-30 — End: 2014-11-30
  Administered 2014-11-30: 3000 mL

## 2014-11-30 MED ORDER — PROPOFOL 10 MG/ML IV BOLUS
INTRAVENOUS | Status: AC
  Filled 2014-11-30: qty 20

## 2014-11-30 MED ORDER — PROPOFOL 10 MG/ML IV BOLUS
INTRAVENOUS | Status: DC | PRN
  Administered 2014-11-30: 150 mg via INTRAVENOUS

## 2014-11-30 MED ORDER — ACETAMINOPHEN 325 MG PO TABS
650.0000 mg | ORAL_TABLET | Freq: Four times a day (QID) | ORAL | Status: DC | PRN
  Administered 2014-12-01 – 2014-12-02 (×3): 650 mg via ORAL
  Filled 2014-11-30 (×2): qty 2

## 2014-11-30 MED ORDER — MIDAZOLAM HCL 5 MG/5ML IJ SOLN
INTRAMUSCULAR | Status: DC | PRN
  Administered 2014-11-30: 2 mg via INTRAVENOUS

## 2014-11-30 MED ORDER — MIDAZOLAM HCL 2 MG/2ML IJ SOLN
INTRAMUSCULAR | Status: AC
  Filled 2014-11-30: qty 2

## 2014-11-30 MED ORDER — MENTHOL 3 MG MT LOZG
1.0000 | LOZENGE | OROMUCOSAL | Status: DC | PRN
  Filled 2014-11-30: qty 9

## 2014-11-30 MED ORDER — LIDOCAINE HCL (CARDIAC) 20 MG/ML IV SOLN
INTRAVENOUS | Status: AC
  Filled 2014-11-30: qty 15

## 2014-11-30 MED ORDER — APIXABAN 2.5 MG PO TABS
2.5000 mg | ORAL_TABLET | Freq: Two times a day (BID) | ORAL | Status: DC
  Administered 2014-12-01 – 2014-12-02 (×3): 2.5 mg via ORAL
  Filled 2014-11-30 (×3): qty 1

## 2014-11-30 MED ORDER — SUGAMMADEX SODIUM 200 MG/2ML IV SOLN
INTRAVENOUS | Status: DC | PRN
  Administered 2014-11-30: 200 mg via INTRAVENOUS

## 2014-11-30 MED ORDER — ONDANSETRON HCL 4 MG/2ML IJ SOLN: 4.0000 mg | Freq: Four times a day (QID) | INTRAMUSCULAR | Status: DC | PRN

## 2014-11-30 MED ORDER — FLEET ENEMA 7-19 GM/118ML RE ENEM: 1.0000 | ENEMA | Freq: Once | RECTAL | Status: DC | PRN

## 2014-11-30 MED ORDER — ONDANSETRON HCL 4 MG/2ML IJ SOLN
INTRAMUSCULAR | Status: DC | PRN
  Administered 2014-11-30: 4 mg via INTRAVENOUS

## 2014-11-30 MED ORDER — ROCURONIUM BROMIDE 100 MG/10ML IV SOLN
INTRAVENOUS | Status: DC | PRN
  Administered 2014-11-30: 40 mg via INTRAVENOUS

## 2014-11-30 MED ORDER — LIDOCAINE HCL (CARDIAC) 20 MG/ML IV SOLN
INTRAVENOUS | Status: DC | PRN
  Administered 2014-11-30: 100 mg via INTRAVENOUS

## 2014-11-30 MED ORDER — SUCCINYLCHOLINE CHLORIDE 20 MG/ML IJ SOLN
INTRAMUSCULAR | Status: AC
  Filled 2014-11-30: qty 1

## 2014-11-30 MED ORDER — ONDANSETRON HCL 4 MG/2ML IJ SOLN: 4.0000 mg | Freq: Once | INTRAMUSCULAR | Status: DC | PRN

## 2014-11-30 MED ORDER — DOCUSATE SODIUM 100 MG PO CAPS
100.0000 mg | ORAL_CAPSULE | Freq: Two times a day (BID) | ORAL | Status: DC
  Administered 2014-11-30 – 2014-12-02 (×5): 100 mg via ORAL
  Filled 2014-11-30 (×5): qty 1

## 2014-11-30 MED ORDER — METOPROLOL SUCCINATE ER 50 MG PO TB24
50.0000 mg | ORAL_TABLET | Freq: Every day | ORAL | Status: DC
  Administered 2014-12-01 – 2014-12-02 (×2): 50 mg via ORAL
  Filled 2014-11-30 (×2): qty 1

## 2014-11-30 MED ORDER — APIXABAN 2.5 MG PO TABS: 2.5000 mg | ORAL_TABLET | Freq: Two times a day (BID) | ORAL | Status: DC

## 2014-11-30 MED ORDER — LACTATED RINGERS IV SOLN
INTRAVENOUS | Status: DC | PRN
  Administered 2014-11-30 (×2): via INTRAVENOUS

## 2014-11-30 MED ORDER — DEXAMETHASONE SODIUM PHOSPHATE 4 MG/ML IJ SOLN
INTRAMUSCULAR | Status: DC | PRN
  Administered 2014-11-30: 4 mg via INTRAVENOUS

## 2014-11-30 MED ORDER — FENTANYL CITRATE (PF) 250 MCG/5ML IJ SOLN
INTRAMUSCULAR | Status: AC
  Filled 2014-11-30: qty 5

## 2014-11-30 MED ORDER — OXYCODONE HCL 5 MG PO TABS
5.0000 mg | ORAL_TABLET | ORAL | Status: DC | PRN
  Administered 2014-11-30 – 2014-12-02 (×6): 10 mg via ORAL
  Filled 2014-11-30 (×2): qty 2
  Filled 2014-11-30: qty 1
  Filled 2014-11-30 (×4): qty 2

## 2014-11-30 MED ORDER — SODIUM CHLORIDE 0.9 % IV SOLN: INTRAVENOUS | Status: DC

## 2014-11-30 MED ORDER — OXYCODONE-ACETAMINOPHEN 5-325 MG PO TABS: 1.0000 | ORAL_TABLET | ORAL | Status: DC | PRN

## 2014-11-30 MED ORDER — FENTANYL CITRATE (PF) 100 MCG/2ML IJ SOLN
25.0000 ug | INTRAMUSCULAR | Status: DC | PRN
  Administered 2014-11-30 (×2): 50 ug via INTRAVENOUS

## 2014-11-30 MED ORDER — SUCCINYLCHOLINE CHLORIDE 20 MG/ML IJ SOLN
INTRAMUSCULAR | Status: DC | PRN
  Administered 2014-11-30: 100 mg via INTRAVENOUS

## 2014-11-30 MED ORDER — METOCLOPRAMIDE HCL 5 MG PO TABS: 5.0000 mg | ORAL_TABLET | Freq: Three times a day (TID) | ORAL | Status: DC | PRN

## 2014-11-30 MED ORDER — ROCURONIUM BROMIDE 50 MG/5ML IV SOLN
INTRAVENOUS | Status: AC
  Filled 2014-11-30: qty 1

## 2014-11-30 MED ORDER — POLYETHYLENE GLYCOL 3350 17 G PO PACK: 17.0000 g | PACK | Freq: Every day | ORAL | Status: DC | PRN

## 2014-11-30 MED ORDER — HYDROCHLOROTHIAZIDE 12.5 MG PO CAPS
12.5000 mg | ORAL_CAPSULE | Freq: Every day | ORAL | Status: DC
  Administered 2014-11-30 – 2014-12-02 (×3): 12.5 mg via ORAL
  Filled 2014-11-30 (×3): qty 1

## 2014-11-30 MED ORDER — EPHEDRINE SULFATE 50 MG/ML IJ SOLN
INTRAMUSCULAR | Status: AC
  Filled 2014-11-30: qty 1

## 2014-11-30 MED ORDER — EPHEDRINE SULFATE 50 MG/ML IJ SOLN
INTRAMUSCULAR | Status: DC | PRN
  Administered 2014-11-30: 10 mg via INTRAVENOUS
  Administered 2014-11-30: 5 mg via INTRAVENOUS

## 2014-11-30 MED ORDER — FENTANYL CITRATE (PF) 100 MCG/2ML IJ SOLN
INTRAMUSCULAR | Status: AC
  Filled 2014-11-30: qty 2

## 2014-11-30 MED ORDER — STERILE WATER FOR INJECTION IJ SOLN
INTRAMUSCULAR | Status: AC
  Filled 2014-11-30: qty 10

## 2014-11-30 MED ORDER — DIPHENHYDRAMINE HCL 12.5 MG/5ML PO ELIX: 12.5000 mg | ORAL_SOLUTION | ORAL | Status: DC | PRN

## 2014-11-30 MED ORDER — METOCLOPRAMIDE HCL 5 MG/ML IJ SOLN: 5.0000 mg | Freq: Three times a day (TID) | INTRAMUSCULAR | Status: DC | PRN

## 2014-11-30 MED ORDER — CEFAZOLIN SODIUM 1-5 GM-% IV SOLN
1.0000 g | Freq: Four times a day (QID) | INTRAVENOUS | Status: AC
  Administered 2014-11-30 (×2): 1 g via INTRAVENOUS
  Filled 2014-11-30 (×3): qty 50

## 2014-11-30 MED ORDER — ONDANSETRON HCL 4 MG PO TABS: 4.0000 mg | ORAL_TABLET | Freq: Four times a day (QID) | ORAL | Status: DC | PRN

## 2014-11-30 MED ORDER — GLYCOPYRROLATE 0.2 MG/ML IJ SOLN
INTRAMUSCULAR | Status: DC | PRN
  Administered 2014-11-30: 0.2 mg via INTRAVENOUS

## 2014-11-30 SURGICAL SUPPLY — 64 items
BIT DRILL 11X150 CANN QR (BIT) ×3 IMPLANT
BIT DRILL 3.8X25 (BIT) ×2 IMPLANT
BLADE SAW SAG 73X25 THK (BLADE) ×2
BLADE SAW SGTL 73X25 THK (BLADE) ×1 IMPLANT
BONE CANC CHIPS 20CC PCAN1/4 (Bone Implant) ×3 IMPLANT
BRUSH FEMORAL CANAL (MISCELLANEOUS) IMPLANT
CHIPS CANC BONE 20CC PCAN1/4 (Bone Implant) ×1 IMPLANT
COVER SURGICAL LIGHT HANDLE (MISCELLANEOUS) ×3 IMPLANT
DRAPE INCISE IOBAN 66X45 STRL (DRAPES) IMPLANT
DRAPE ORTHO SPLIT 77X108 STRL (DRAPES) ×6
DRAPE SURG ORHT 6 SPLT 77X108 (DRAPES) ×2 IMPLANT
DRAPE U-SHAPE 47X51 STRL (DRAPES) ×3 IMPLANT
DRSG ADAPTIC 3X8 NADH LF (GAUZE/BANDAGES/DRESSINGS) ×3 IMPLANT
DRSG PAD ABDOMINAL 8X10 ST (GAUZE/BANDAGES/DRESSINGS) ×6 IMPLANT
DURAPREP 26ML APPLICATOR (WOUND CARE) ×3 IMPLANT
ELECT BLADE 4.0 EZ CLEAN MEGAD (MISCELLANEOUS) ×3
ELECT CAUTERY BLADE 6.4 (BLADE) ×3 IMPLANT
ELECT REM PT RETURN 9FT ADLT (ELECTROSURGICAL) ×3
ELECTRODE BLDE 4.0 EZ CLN MEGD (MISCELLANEOUS) ×1 IMPLANT
ELECTRODE REM PT RTRN 9FT ADLT (ELECTROSURGICAL) ×1 IMPLANT
ELIMINATOR HOLE APEX DEPUY (Hips) IMPLANT
FACESHIELD WRAPAROUND (MASK) ×6 IMPLANT
GAUZE SPONGE 4X4 12PLY STRL (GAUZE/BANDAGES/DRESSINGS) ×3 IMPLANT
GLOVE BIOGEL PI IND STRL 8 (GLOVE) ×2 IMPLANT
GLOVE BIOGEL PI INDICATOR 8 (GLOVE) ×4
GLOVE ORTHO TXT STRL SZ7.5 (GLOVE) ×6 IMPLANT
GLOVE SURG ORTHO 8.0 STRL STRW (GLOVE) ×6 IMPLANT
GOWN STRL REUS W/ TWL LRG LVL3 (GOWN DISPOSABLE) ×1 IMPLANT
GOWN STRL REUS W/ TWL XL LVL3 (GOWN DISPOSABLE) ×1 IMPLANT
GOWN STRL REUS W/TWL 2XL LVL3 (GOWN DISPOSABLE) ×3 IMPLANT
GOWN STRL REUS W/TWL LRG LVL3 (GOWN DISPOSABLE) ×2
GOWN STRL REUS W/TWL XL LVL3 (GOWN DISPOSABLE) ×2
HANDPIECE INTERPULSE COAX TIP (DISPOSABLE)
HEAD FEMORAL DELTA 28MM 12/14 (Head) ×3 IMPLANT
HOOD PEEL AWAY FACE SHEILD DIS (HOOD) ×3 IMPLANT
IMMOBILIZER KNEE 22 UNIV (SOFTGOODS) ×3 IMPLANT
KIT BASIN OR (CUSTOM PROCEDURE TRAY) ×3 IMPLANT
KIT ROOM TURNOVER OR (KITS) ×3 IMPLANT
LINER ACET 10D LIP 28X50 (Hips) ×3 IMPLANT
MANIFOLD NEPTUNE II (INSTRUMENTS) ×3 IMPLANT
NEEDLE 22X1 1/2 (OR ONLY) (NEEDLE) ×3 IMPLANT
NEEDLE MAYO TROCAR (NEEDLE) IMPLANT
NS IRRIG 1000ML POUR BTL (IV SOLUTION) ×3 IMPLANT
PACK TOTAL JOINT (CUSTOM PROCEDURE TRAY) ×3 IMPLANT
PACK UNIVERSAL I (CUSTOM PROCEDURE TRAY) ×3 IMPLANT
PAD ARMBOARD 7.5X6 YLW CONV (MISCELLANEOUS) ×6 IMPLANT
PRESSURIZER FEMORAL UNIV (MISCELLANEOUS) IMPLANT
RING LOCK ACET OD 50 (Hips) ×3 IMPLANT
SET HNDPC FAN SPRY TIP SCT (DISPOSABLE) IMPLANT
SPONGE GAUZE 4X4 12PLY STER LF (GAUZE/BANDAGES/DRESSINGS) ×3 IMPLANT
STAPLER VISISTAT 35W (STAPLE) ×3 IMPLANT
SUCTION FRAZIER TIP 10 FR DISP (SUCTIONS) ×3 IMPLANT
SUT ETHIBOND 2 V 37 (SUTURE) ×3 IMPLANT
SUT VIC AB 0 CT1 27 (SUTURE) ×3
SUT VIC AB 0 CT1 27XBRD ANBCTR (SUTURE) ×1 IMPLANT
SUT VIC AB 2-0 CT1 27 (SUTURE) ×4
SUT VIC AB 2-0 CT1 TAPERPNT 27 (SUTURE) ×2 IMPLANT
SWAB COLLECTION DEVICE MRSA (MISCELLANEOUS) ×6 IMPLANT
SYR CONTROL 10ML LL (SYRINGE) ×3 IMPLANT
TOWEL OR 17X24 6PK STRL BLUE (TOWEL DISPOSABLE) ×3 IMPLANT
TOWEL OR 17X26 10 PK STRL BLUE (TOWEL DISPOSABLE) ×3 IMPLANT
TOWER CARTRIDGE SMART MIX (DISPOSABLE) IMPLANT
TUBE ANAEROBIC SPECIMEN COL (MISCELLANEOUS) ×6 IMPLANT
WATER STERILE IRR 1000ML POUR (IV SOLUTION) ×3 IMPLANT

## 2014-11-30 NOTE — Anesthesia Procedure Notes (Signed)
Procedure Name: Intubation Date/Time: 11/30/2014 7:38 AM Performed by: Ollen Bowl Pre-anesthesia Checklist: Patient identified, Emergency Drugs available, Suction available, Patient being monitored and Timeout performed Patient Re-evaluated:Patient Re-evaluated prior to inductionOxygen Delivery Method: Circle system utilized and Simple face mask Preoxygenation: Pre-oxygenation with 100% oxygen Intubation Type: IV induction Ventilation: Mask ventilation without difficulty Laryngoscope Size: Miller and 2 Grade View: Grade I Tube type: Oral Tube size: 7.5 mm Number of attempts: 1 Airway Equipment and Method: Patient positioned with wedge pillow and Stylet Placement Confirmation: ETT inserted through vocal cords under direct vision,  positive ETCO2 and breath sounds checked- equal and bilateral Secured at: 22 cm Tube secured with: Tape Dental Injury: Teeth and Oropharynx as per pre-operative assessment

## 2014-11-30 NOTE — Anesthesia Postprocedure Evaluation (Signed)
  Anesthesia Post-op Note  Patient: Rhonda Hart  Procedure(s) Performed: Procedure(s) (LRB): TOTAL LEFT HIP REVISION (Left)  Patient Location: PACU  Anesthesia Type: General  Level of Consciousness: awake and alert   Airway and Oxygen Therapy: Patient Spontanous Breathing  Post-op Pain: mild  Post-op Assessment: Post-op Vital signs reviewed, Patient's Cardiovascular Status Stable, Respiratory Function Stable, Patent Airway and No signs of Nausea or vomiting  Last Vitals:  Filed Vitals:   11/30/14 1056  BP:   Pulse:   Temp: 36.2 C  Resp:     Post-op Vital Signs: stable   Complications: No apparent anesthesia complications

## 2014-11-30 NOTE — Brief Op Note (Signed)
11/30/2014  10:03 AM  PATIENT:  Rhonda Hart  69 y.o. female  PRE-OPERATIVE DIAGNOSIS:  LEFT HIP, PROSTHESIS LOOSENING, OA  POST-OPERATIVE DIAGNOSIS:  LEFT HIP, PROSTHESIS LOOSENING, OA  PROCEDURE:  Procedure(s): TOTAL LEFT HIP REVISION (Left) Poly exchange  SURGEON:  Surgeon(s) and Role:    * Earlie Server, MD - Primary  PHYSICIAN ASSISTANT: Chriss Czar, PA-C  ASSISTANTS:   ANESTHESIA:   general  EBL:  Total I/O In: 1200 [I.V.:1200] Out: 300 [Blood:300]  BLOOD ADMINISTERED:none  DRAINS: none   LOCAL MEDICATIONS USED:  NONE  SPECIMEN:  Source of Specimen:  acetabular fluid culture  DISPOSITION OF SPECIMEN:  micro lab  COUNTS:  YES  TOURNIQUET:  * No tourniquets in log *  DICTATION: .Other Dictation: Dictation Number unknown  PLAN OF CARE: Admit to inpatient   PATIENT DISPOSITION:  PACU - hemodynamically stable.   Delay start of Pharmacological VTE agent (>24hrs) due to surgical blood loss or risk of bleeding: yes

## 2014-11-30 NOTE — Progress Notes (Signed)
Receiving report from [place] at this time

## 2014-11-30 NOTE — Transfer of Care (Signed)
Immediate Anesthesia Transfer of Care Note  Patient: Rhonda Hart  Procedure(s) Performed: Procedure(s): TOTAL LEFT HIP REVISION (Left)  Patient Location: PACU  Anesthesia Type:General  Level of Consciousness: awake and alert   Airway & Oxygen Therapy: Patient Spontanous Breathing and Patient connected to nasal cannula oxygen  Post-op Assessment: Report given to RN, Post -op Vital signs reviewed and stable and Patient moving all extremities X 4  Post vital signs: Reviewed and stable  Last Vitals:  Filed Vitals:   11/30/14 0602  BP: 189/79  Pulse:   Temp:   Resp:     Complications: No apparent anesthesia complications

## 2014-11-30 NOTE — H&P (View-Only) (Signed)
TOTAL HIP REVISION ADMISSION H&P  Patient is admitted for left revision total hip arthroplasty.  Subjective:  Chief Complaint: left hip pain  HPI: Rhonda Hart, 69 y.o. female, has a history of pain and functional disability in the left hip due to arthritis and patient has failed non-surgical conservative treatments for greater than 12 weeks to include NSAID's and/or analgesics, use of assistive devices and activity modification. The indications for the revision total hip arthroplasty are loosening of one or more components and bearing surface wear leading to  implant or hip misalignment.  Onset of symptoms was gradual starting 2 years ago with gradually worsening course since that time.  Prior procedures on the left hip include arthroplasty.  Patient currently rates pain in the left hip at 10 out of 10 with activity.  There is night pain, worsening of pain with activity and weight bearing, pain that interfers with activities of daily living and pain with passive range of motion. 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  This condition presents safety issues increasing the risk of falls.  there is no current active infection.  There are no active problems to display for this patient.  Past Medical History  Diagnosis Date  . Hypertension     Past Surgical History  Procedure Laterality Date  . Abdominal hysterectomy       (Not in a hospital admission) Allergies  Allergen Reactions  . Coumadin [Warfarin Sodium] Rash    Social History  Substance Use Topics  . Smoking status: Current Some Day Smoker  . Smokeless tobacco: Not on file  . Alcohol Use: No    No family history on file.    Review of Systems  Musculoskeletal: Positive for joint pain.  All other systems reviewed and are negative.   Objective:  Physical Exam  Constitutional: She is oriented to person, place, and time. She appears well-developed and well-nourished. No distress.  HENT:  Head:  Normocephalic and atraumatic.  Nose: Nose normal.  Eyes: Conjunctivae and EOM are normal. Pupils are equal, round, and reactive to light.  Neck: Normal range of motion. Neck supple.  Cardiovascular: Normal rate, regular rhythm, normal heart sounds and intact distal pulses.   Respiratory: Effort normal and breath sounds normal. No respiratory distress. She has no wheezes.  GI: Soft. Bowel sounds are normal. She exhibits no distension. There is no tenderness.  Musculoskeletal:       Left hip: She exhibits decreased range of motion and tenderness.  Lymphadenopathy:    She has no cervical adenopathy.  Neurological: She is alert and oriented to person, place, and time. No cranial nerve deficit.  Skin: Skin is warm and dry. No rash noted. No erythema.  Psychiatric: She has a normal mood and affect. Her behavior is normal.    Vital signs in last 24 hours: @VSRANGES@   Labs:   There is no height on file to calculate BMI.  Imaging Review:  Plain radiographs demonstrate previous total hip degenerative joint disease of the left hip(s). There is evidence of loosening of the polywear.The bone quality appears to be good for age and reported activity level.   Assessment/Plan:  End stage arthritis, left hip(s) with failed previous arthroplasty.  The patient history, physical examination, clinical judgement of the provider and imaging studies are consistent with end stage degenerative joint disease of the left hip(s), previous total hip arthroplasty. Revision total hip arthroplasty is deemed medically necessary. The treatment options including medical management, injection therapy, arthroscopy and   arthroplasty were discussed at length. The risks and benefits of total hip arthroplasty were presented and reviewed. The risks due to aseptic loosening, infection, stiffness, dislocation/subluxation,  thromboembolic complications and other imponderables were discussed.  The patient acknowledged the  explanation, agreed to proceed with the plan and consent was signed. Patient is being admitted for inpatient treatment for surgery, pain control, PT, OT, prophylactic antibiotics, VTE prophylaxis, progressive ambulation and ADL's and discharge planning. The patient is planning to be discharged home with home health services 

## 2014-11-30 NOTE — Anesthesia Preprocedure Evaluation (Addendum)
Anesthesia Evaluation  Patient identified by MRN, date of birth, ID band Patient awake    Reviewed: Allergy & Precautions, NPO status , Patient's Chart, lab work & pertinent test results, reviewed documented beta blocker date and time   History of Anesthesia Complications Negative for: history of anesthetic complications  Airway Mallampati: II  TM Distance: >3 FB Neck ROM: Full    Dental  (+) Dental Advisory Given, Upper Dentures, Lower Dentures   Pulmonary neg pulmonary ROS,    Pulmonary exam normal breath sounds clear to auscultation       Cardiovascular Exercise Tolerance: Good hypertension, Pt. on medications and Pt. on home beta blockers (-) angina(-) CAD and (-) Past MI Normal cardiovascular exam Rhythm:Regular Rate:Normal     Neuro/Psych negative neurological ROS  negative psych ROS   GI/Hepatic negative GI ROS, Neg liver ROS,   Endo/Other  negative endocrine ROS  Renal/GU negative Renal ROS     Musculoskeletal  (+) Arthritis , Osteoarthritis,    Abdominal   Peds  Hematology negative hematology ROS (+)   Anesthesia Other Findings Day of surgery medications reviewed with the patient.  Reproductive/Obstetrics negative OB ROS                            Anesthesia Physical Anesthesia Plan  ASA: II  Anesthesia Plan: General   Post-op Pain Management:    Induction: Intravenous  Airway Management Planned: Oral ETT  Additional Equipment:   Intra-op Plan:   Post-operative Plan: Extubation in OR  Informed Consent: I have reviewed the patients History and Physical, chart, labs and discussed the procedure including the risks, benefits and alternatives for the proposed anesthesia with the patient or authorized representative who has indicated his/her understanding and acceptance.   Dental advisory given  Plan Discussed with: CRNA  Anesthesia Plan Comments: (Risks/benefits of  general anesthesia discussed with patient including risk of damage to teeth, lips, gum, and tongue, nausea/vomiting, allergic reactions to medications, and the possibility of heart attack, stroke and death.  All patient questions answered.  Patient wishes to proceed.)        Anesthesia Quick Evaluation

## 2014-11-30 NOTE — Interval H&P Note (Signed)
History and Physical Interval Note:  11/30/2014 7:22 AM  Rhonda Hart  has presented today for surgery, with the diagnosis of LEFT HIP, PROSTHESIS LOOSENING, OA  The various methods of treatment have been discussed with the patient and family. After consideration of risks, benefits and other options for treatment, the patient has consented to  Procedure(s): TOTAL LEFT HIP REVISION (Left) as a surgical intervention .  The patient's history has been reviewed, patient examined, no change in status, stable for surgery.  I have reviewed the patient's chart and labs.  Questions were answered to the patient's satisfaction.     Madiha Bambrick JR,W D

## 2014-11-30 NOTE — Evaluation (Signed)
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 PMHx: HTN, hysterectomy  Clinical Impression  Rhonda Hart is moving well for POD #0 with limited activity secondary to pain. Pt stating she has been through this before but unable to recall precautions. Pt educated for all precautions, transfers, plan and function. Pt with decreased strength, transfers and gait who will benefit from acute therapy to maximize mobility, function and independence to decrease burden of care.     Follow Up Recommendations Home health PT    Equipment Recommendations  None recommended by PT    Recommendations for Other Services       Precautions / Restrictions Precautions Precautions: Posterior Hip;Fall Restrictions Weight Bearing Restrictions: Yes LLE Weight Bearing: Weight bearing as tolerated      Mobility  Bed Mobility Overal bed mobility: Needs Assistance Bed Mobility: Supine to Sit     Supine to sit: Min assist     General bed mobility comments: cues for hand placement and sequence and min assist to move LLE to EOB, increased time  Transfers Overall transfer level: Needs assistance   Transfers: Sit to/from Stand Sit to Stand: Supervision         General transfer comment: cues for hand placement and LLE position  Ambulation/Gait Ambulation/Gait assistance: Min guard Ambulation Distance (Feet): 15 Feet Assistive device: Rolling walker (2 wheeled) Gait Pattern/deviations: Step-to pattern;Trunk flexed   Gait velocity interpretation: Below normal speed for age/gender General Gait Details: cues for posture, position in RW and sequence  Stairs            Wheelchair Mobility    Modified Rankin (Stroke Patients Only)       Balance                                             Pertinent Vitals/Pain Pain Assessment: 0-10 Pain Score: 8  Pain Location: left hip Pain Descriptors / Indicators: Aching;Throbbing Pain Intervention(s): Limited activity within patient's tolerance;Monitored during session;RN gave pain meds during session;Repositioned;Ice applied    Home Living Family/patient expects to be discharged to:: Private residence Living Arrangements: Spouse/significant other Available Help at Discharge: Family;Available 24 hours/day Type of Home: House Home Access: Stairs to enter   [employer] of Steps: 1 to enter then 2 to kitchen Home Layout: One level Home Equipment: Environmental consultant - 2 wheels;Bedside commode      Prior Function Level of Independence: Independent               Hand Dominance        Extremity/Trunk Assessment   Upper Extremity Assessment: Overall WFL for tasks assessed           Lower Extremity Assessment: LLE deficits/detail   LLE Deficits / Details: limited strength and ROM as expected post op  Cervical / Trunk Assessment: Normal  Communication   Communication: No difficulties  Cognition Arousal/Alertness: Awake/alert Behavior During Therapy: WFL for tasks assessed/performed Overall Cognitive Status: Within Functional Limits for tasks assessed                      General Comments      Exercises  Assessment/Plan    PT Assessment Patient needs continued PT services  PT Diagnosis Difficulty walking;Acute pain   PT Problem List Decreased strength;Decreased range of motion;Decreased activity tolerance;Decreased balance;Decreased mobility;Pain;Decreased knowledge of use of DME;Decreased knowledge of precautions  PT Treatment Interventions DME instruction;Gait training;Stair training;Functional mobility training;Therapeutic activities;Therapeutic exercise;Patient/family education   PT Goals (Current goals can be found in the Care Plan section) Acute  Rehab PT Goals Patient Stated Goal: return home and "keep this hip til I die" PT Goal Formulation: With patient Time For Goal Achievement: 12/07/14 Potential to Achieve Goals: Good    Frequency 7X/week   Barriers to discharge        Co-evaluation               End of Session Equipment Utilized During Treatment: Gait belt;Left knee immobilizer Activity Tolerance: Patient tolerated treatment well Patient left: in chair;with call bell/phone within reach;with nursing/sitter in room;with family/visitor present Nurse Communication: Mobility status;Patient requests pain meds;Precautions;Weight bearing status         Time: [id number] PT Time Calculation (min) (ACUTE ONLY): 21 min   Charges:   PT Evaluation $Initial PT Evaluation Tier I: 1 Procedure     PT G CodesMelford Aase 11/30/2014, 2:32 PM Elwyn Reach, [place]

## 2014-11-30 NOTE — Progress Notes (Signed)
Utilization review completed.  

## 2014-12-01 DIAGNOSIS — Z96649 Presence of unspecified artificial hip joint: Secondary | ICD-10-CM

## 2014-12-01 LAB — BASIC METABOLIC PANEL
Anion gap: 10 (ref 5–15)
BUN: 13 mg/dL (ref 6–20)
CHLORIDE: 101 mmol/L (ref 101–111)
CO2: 24 mmol/L (ref 22–32)
CREATININE: 0.76 mg/dL (ref 0.44–1.00)
Calcium: 8.8 mg/dL — ABNORMAL LOW (ref 8.9–10.3)
GFR calc Af Amer: >60 mL/min (ref >60)
GFR calc non Af Amer: >60 mL/min (ref >60)
GLUCOSE: 118 mg/dL — AB (ref 65–99)
POTASSIUM: 3.7 mmol/L (ref 3.5–5.1)
Sodium: 135 mmol/L (ref 135–145)

## 2014-12-01 LAB — CBC
HCT: 34.2 % — ABNORMAL LOW (ref 36.0–46.0)
HEMOGLOBIN: 11.2 g/dL — AB (ref 12.0–15.0)
MCH: 29.8 pg (ref 26.0–34.0)
MCHC: 32.7 g/dL (ref 30.0–36.0)
MCV: 91 fL (ref 78.0–100.0)
Platelets: 302 10*3/uL (ref 150–400)
RBC: 3.76 MIL/uL — AB (ref 3.87–5.11)
RDW: 13.5 % (ref 11.5–15.5)
WBC: 10.4 10*3/uL (ref 4.0–10.5)

## 2014-12-01 MED ORDER — HYDROCODONE-ACETAMINOPHEN 5-325 MG PO TABS
1.0000 | ORAL_TABLET | Freq: Four times a day (QID) | ORAL | Status: DC | PRN
  Administered 2014-12-01 – 2014-12-02 (×3): 2 via ORAL
  Filled 2014-12-01 (×3): qty 2

## 2014-12-01 NOTE — Discharge Instructions (Signed)
INSTRUCTIONS AFTER JOINT REPLACEMENT  ° °o Remove items at home which could result in a fall. This includes throw rugs or furniture in walking pathways °o ICE to the affected joint every three hours while awake for 30 minutes at a time, for at least the first 3-5 days, and then as needed for pain and swelling.  Continue to use ice for pain and swelling. You may notice swelling that will progress down to the foot and ankle.  This is normal after surgery.  Elevate your leg when you are not up walking on it.   °o Continue to use the breathing machine you got in the hospital (incentive spirometer) which will help keep your temperature down.  It is common for your temperature to cycle up and down following surgery, especially at night when you are not up moving around and exerting yourself.  The breathing machine keeps your lungs expanded and your temperature down. ° ° °DIET:  As you were doing prior to hospitalization, we recommend a well-balanced diet. ° °DRESSING / WOUND CARE / SHOWERING ° °You may change your dressing 3-5 days after surgery.  Then change the dressing every day with sterile gauze.  Please use good hand washing techniques before changing the dressing.  Do not use any lotions or creams on the incision until instructed by your surgeon. ° °ACTIVITY ° °o Increase activity slowly as tolerated, but follow the weight bearing instructions below.   °o No driving for 6 weeks or until further direction given by your physician.  You cannot drive while taking narcotics.  °o No lifting or carrying greater than 10 lbs. until further directed by your surgeon. °o Avoid periods of inactivity such as sitting longer than an hour when not asleep. This helps prevent blood clots.  °o You may return to work once you are authorized by your doctor.  ° ° ° °WEIGHT BEARING  ° °Weight bearing as tolerated with assist device (walker, cane, etc) as directed, use it as long as suggested by your surgeon or therapist, typically at  least 4-6 weeks. ° ° °EXERCISES ° °Results after joint replacement surgery are often greatly improved when you follow the exercise, range of motion and muscle strengthening exercises prescribed by your doctor. Safety measures are also important to protect the joint from further injury. Any time any of these exercises cause you to have increased pain or swelling, decrease what you are doing until you are comfortable again and then slowly increase them. If you have problems or questions, call your caregiver or physical therapist for advice.  ° °Rehabilitation is important following a joint replacement. After just a few days of immobilization, the muscles of the leg can become weakened and shrink (atrophy).  These exercises are designed to build up the tone and strength of the thigh and leg muscles and to improve motion. Often times heat used for twenty to thirty minutes before working out will loosen up your tissues and help with improving the range of motion but do not use heat for the first two weeks following surgery (sometimes heat can increase post-operative swelling).  ° °These exercises can be done on a training (exercise) mat, on the floor, on a table or on a bed. Use whatever works the best and is most comfortable for you.    Use music or television while you are exercising so that the exercises are a pleasant break in your day. This will make your life better with the exercises acting as a break   in your routine that you can look forward to.   Perform all exercises about fifteen times, three times per day or as directed.  You should exercise both the operative leg and the other leg as well. ° °Exercises include: °  °• Quad Sets - Tighten up the muscle on the front of the thigh (Quad) and hold for 5-10 seconds.   °• Straight Leg Raises - With your knee straight (if you were given a brace, keep it on), lift the leg to 60 degrees, hold for 3 seconds, and slowly lower the leg.  Perform this exercise against  resistance later as your leg gets stronger.  °• Leg Slides: Lying on your back, slowly slide your foot toward your buttocks, bending your knee up off the floor (only go as far as is comfortable). Then slowly slide your foot back down until your leg is flat on the floor again.  °• Angel Wings: Lying on your back spread your legs to the side as far apart as you can without causing discomfort.  °• Hamstring Strength:  Lying on your back, push your heel against the floor with your leg straight by tightening up the muscles of your buttocks.  Repeat, but this time bend your knee to a comfortable angle, and push your heel against the floor.  You may put a pillow under the heel to make it more comfortable if necessary.  ° °A rehabilitation program following joint replacement surgery can speed recovery and prevent re-injury in the future due to weakened muscles. Contact your doctor or a physical therapist for more information on knee rehabilitation.  ° ° °CONSTIPATION ° °Constipation is defined medically as fewer than three stools per week and severe constipation as less than one stool per week.  Even if you have a regular bowel pattern at home, your normal regimen is likely to be disrupted due to multiple reasons following surgery.  Combination of anesthesia, postoperative narcotics, change in appetite and fluid intake all can affect your bowels.  ° °YOU MUST use at least one of the following options; they are listed in order of increasing strength to get the job done.  They are all available over the counter, and you may need to use some, POSSIBLY even all of these options:   ° °Drink plenty of fluids (prune juice may be helpful) and high fiber foods °Colace 100 mg by mouth twice a day  °Senokot for constipation as directed and as needed Dulcolax (bisacodyl), take with full glass of water  °Miralax (polyethylene glycol) once or twice a day as needed. ° °If you have tried all these things and are unable to have a bowel  movement in the first 3-4 days after surgery call either your surgeon or your primary doctor.   ° °If you experience loose stools or diarrhea, hold the medications until you stool forms back up.  If your symptoms do not get better within 1 week or if they get worse, check with your doctor.  If you experience "the worst abdominal pain ever" or develop nausea or vomiting, please contact the office immediately for further recommendations for treatment. ° ° °ITCHING:  If you experience itching with your medications, try taking only a single pain pill, or even half a pain pill at a time.  You can also use Benadryl over the counter for itching or also to help with sleep.  ° °TED HOSE STOCKINGS:  Use stockings on both legs until for at least 2 weeks or as   directed by physician office. They may be removed at night for sleeping. ° °MEDICATIONS:  See your medication summary on the “After Visit Summary” that nursing will review with you.  You may have some home medications which will be placed on hold until you complete the course of blood thinner medication.  It is important for you to complete the blood thinner medication as prescribed. ° °PRECAUTIONS:  If you experience chest pain or shortness of breath - call 911 immediately for transfer to the hospital emergency department.  ° °If you develop a fever greater that 101 F, purulent drainage from wound, increased redness or drainage from wound, foul odor from the wound/dressing, or calf pain - CONTACT YOUR SURGEON.   °                                                °FOLLOW-UP APPOINTMENTS:  If you do not already have a post-op appointment, please call the office for an appointment to be seen by your surgeon.  Guidelines for how soon to be seen are listed in your “After Visit Summary”, but are typically between 1-4 weeks after surgery. ° °OTHER INSTRUCTIONS:  ° °Knee Replacement:  Do not place pillow under knee, focus on keeping the knee straight while resting. CPM  instructions: 0-90 degrees, 2 hours in the morning, 2 hours in the afternoon, and 2 hours in the evening. Place foam block, curve side up under heel at all times except when in CPM or when walking.  DO NOT modify, tear, cut, or change the foam block in any way. ° °MAKE SURE YOU:  °• Understand these instructions.  °• Get help right away if you are not doing well or get worse.  ° ° °Thank you for letting us be a part of your medical care team.  It is a privilege we respect greatly.  We hope these instructions will help you stay on track for a fast and full recovery!  ° ° ° ° ° °Information on my medicine - ELIQUIS® (apixaban) ° °Why was Eliquis® prescribed for you? °Eliquis® was prescribed for you to reduce the risk of blood clots forming after orthopedic surgery.   ° °What do You need to know about Eliquis®? °Take your Eliquis® TWICE DAILY - one tablet in the morning and one tablet in the evening with or without food.  It would be best to take the dose about the same time each day. ° °If you have difficulty swallowing the tablet whole please discuss with your pharmacist how to take the medication safely. ° °Take Eliquis® exactly as prescribed by your doctor and DO NOT stop taking Eliquis® without talking to the doctor who prescribed the medication.  Stopping without other medication to take the place of Eliquis® may increase your risk of developing a clot. ° °After discharge, you should have regular check-up appointments with your healthcare provider that is prescribing your Eliquis®. ° °What do you do if you miss a dose? °If a dose of ELIQUIS® is not taken at the scheduled time, take it as soon as possible on the same day and twice-daily administration should be resumed.  The dose should not be doubled to make up for a missed dose.  Do not take more than one tablet of ELIQUIS at the same time. ° °Important Safety Information °A possible side effect of Eliquis®   is bleeding. You should call your healthcare provider  right away if you experience any of the following: °? Bleeding from an injury or your nose that does not stop. °? Unusual colored urine (red or dark brown) or unusual colored stools (red or black). °? Unusual bruising for unknown reasons. °? A serious fall or if you hit your head (even if there is no bleeding). ° °Some medicines may interact with Eliquis® and might increase your risk of bleeding or clotting while on Eliquis®. To help avoid this, consult your healthcare provider or pharmacist prior to using any new prescription or non-prescription medications, including herbals, vitamins, non-steroidal anti-inflammatory drugs (NSAIDs) and supplements. ° °This website has more information on Eliquis® (apixaban): http://www.eliquis.com/eliquis/home ° ° °

## 2014-12-01 NOTE — Evaluation (Signed)
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 PMHx: HTN, hysterectomy   Clinical Impression   Pt was independent prior to admission.  Presents with increased L hip pain despite medication, generalized weakness and decreased balance interfering with ability to perform ADL and mobility at her baseline.  Pt disappointed by her decreased tolerance for activity and pain limitations and concerned about plan to go home tomorrow.  Pt educated in hip precautions and practiced use of AE for LB bathing and dressing.  Will follow acutely.    Follow Up Recommendations  Home health OT;Supervision/Assistance - 24 hour    Equipment Recommendations  Tub/shower bench    Recommendations for Other Services       Precautions / Restrictions Precautions Precautions: Posterior Hip;Fall Precaution Booklet Issued: Yes (comment) Precaution Comments: pt able to recall 2/3 hip precautions. reviewed all 3 with cues to adhere to them in session Restrictions Weight Bearing Restrictions: Yes LLE Weight Bearing: Weight bearing as tolerated      Mobility Bed Mobility Overal bed mobility: Needs Assistance Bed Mobility: Supine to Sit;Sit to Supine     Supine to sit: Mod assist Sit to supine: Min assist   General bed mobility comments: assist for L LE and use of pad to advance hips to EOB  Transfers Overall transfer level: Needs assistance Equipment used: Rolling walker (2 wheeled) Transfers: Sit to/from Stand Sit to Stand: Min assist         General transfer comment: from elevated bed, cues for hand placement    Balance Overall balance assessment: Needs assistance Sitting-balance support: Feet supported Sitting balance-Leahy Scale: Fair Sitting balance - Comments:  attempts to unload L LE due to pain   Standing balance support: Bilateral upper extremity supported Standing balance-Leahy Scale: Poor Standing balance comment: dependent on B UE support to stand                            ADL Overall ADL's : Needs assistance/impaired Eating/Feeding: Independent;Sitting   Grooming: Wash/dry hands;Wash/dry face;Sitting;Set up   Upper Body Bathing: Set up;Sitting   Lower Body Bathing: Moderate assistance;Sit to/from stand Lower Body Bathing Details (indicate cue type and reason): instructed in use of long handled bath sponge Upper Body Dressing : Set up;Sitting   Lower Body Dressing: Moderate assistance;Sit to/from stand Lower Body Dressing Details (indicate cue type and reason): practiced use of reacher and sock aide Toilet Transfer: Minimal assistance;Stand-pivot;RW   Toileting- Clothing Manipulation and Hygiene: Minimal assistance;Sit to/from stand         General ADL Comments: Pt limited by pain today.      Vision     Perception     Praxis      Pertinent Vitals/Pain Pain Assessment: 0-10 Pain Score: 7  Pain Location: R hip Pain Descriptors / Indicators: Aching Pain Intervention(s): Limited activity within patient's tolerance;Monitored during session;Premedicated before session;Repositioned;Ice applied     Hand Dominance Right   Extremity/Trunk Assessment Upper Extremity Assessment Upper Extremity Assessment: Overall WFL for tasks assessed   Lower Extremity Assessment Lower Extremity Assessment: Defer to PT evaluation   Cervical / Trunk Assessment Cervical / Trunk Assessment: Normal   Communication Communication Communication: No difficulties   Cognition Arousal/Alertness: Awake/alert Behavior During Therapy: WFL for tasks  assessed/performed Overall Cognitive Status: Within Functional Limits for tasks assessed                     General Comments       Exercises       Shoulder Instructions       Home Living Family/patient expects to be discharged to:: Private residence Living Arrangements: Spouse/significant other Available Help at Discharge: Family;Available 24 hours/day Type of Home: House Home Access: Stairs to enter [employer] of Steps: 1 to enter then 2 to kitchen   Home Layout: One level     Bathroom Shower/Tub: Teacher, early years/pre: Standard     Home Equipment: Environmental consultant - 2 wheels;Bedside commode;Adaptive equipment Adaptive Equipment: Reacher        Prior Functioning/Environment Level of Independence: Independent             OT Diagnosis: Generalized weakness;Acute pain   OT Problem List: Decreased strength;Decreased activity tolerance;Impaired balance (sitting and/or standing);Decreased knowledge of use of DME or AE;Decreased knowledge of precautions;Pain   OT Treatment/Interventions: Self-care/ADL training;DME and/or AE instruction;Balance training;Patient/family education    OT Goals(Current goals can be found in the care plan section) Acute Rehab OT Goals Patient Stated Goal: return home and "keep this hip til I die" OT Goal Formulation: With patient Time For Goal Achievement: 12/08/14 Potential to Achieve Goals: Good ADL Goals Pt Will Perform Grooming: with supervision;standing Pt Will Perform Lower Body Bathing: with supervision;sit to/from stand;with adaptive equipment Pt Will Perform Lower Body Dressing: with supervision;with adaptive equipment;sit to/from stand Pt Will Transfer to Toilet: with supervision;ambulating;bedside commode (over toilet) Pt Will Perform Toileting - Clothing Manipulation and hygiene: with supervision;sit to/from stand Pt Will Perform Tub/Shower Transfer: Tub transfer;with min assist;ambulating;tub bench;rolling walker Additional ADL Goal #1: Pt will generalize posterior hip precautions in mobility and ADL independently.  OT Frequency: Min 2X/week   Barriers to D/C:             Co-evaluation              End of Session Equipment Utilized During Treatment: Rolling walker;Gait belt  Activity Tolerance: Patient limited by pain Patient left: in bed;with call bell/phone within reach;with family/visitor present   Time: 1457-1520 OT Time Calculation (min): 23 min Charges:  OT General Charges $OT Visit: 1 Procedure OT Evaluation $Initial OT Evaluation Tier I: 1 Procedure OT Treatments $Self Care/Home Management : 8-22 mins G-Codes:    Malka So 12/01/2014, 3:32 PM  [phone]

## 2014-12-01 NOTE — Progress Notes (Signed)
Physical Therapy Treatment Patient Details Name: Rhonda Hart MRN: [id number] DOB: 1945/02/21 Today's Date: 12/01/2014    History of Present Illness pt admitted for L THA revision secondary to loosening hardware. Pt with prior L THA 19 years ago and 10 years ago. PMHx: HTN, hysterectomy    PT Comments    Pt limited by dizziness and pain today. Acute PT to continue.  Follow Up Recommendations  Home health PT     Equipment Recommendations  None recommended by PT    Precautions / Restrictions Precautions Precautions: Posterior Hip;Fall Precaution Comments: pt able to recall 2/3 hip precautions. reviewed all 3 with cues to adhere to them in session Restrictions LLE Weight Bearing: Weight bearing as tolerated    Mobility  Bed Mobility Overal bed mobility: Needs Assistance Bed Mobility: Supine to Sit     Supine to sit: Min assist     General bed mobility comments: cues for hand placement and sequence and min assist to move LLE to EOB, increased time  Transfers Overall transfer level: Needs assistance Equipment used: Rolling walker (2 wheeled) Transfers: Sit to/from Stand Sit to Stand: Min assist         General transfer comment: with bed elevated, cues on hand and left leg placement  Ambulation/Gait Ambulation/Gait assistance: Min assist Ambulation Distance (Feet): 12 Feet Assistive device: Rolling walker (2 wheeled) Gait Pattern/deviations: Step-to pattern;Antalgic;Trunk flexed;Decreased stance time - left;Decreased step length - right;Decreased step length - left;Narrow base of support Gait velocity: decreased Gait velocity interpretation: Below normal speed for age/gender General Gait Details: cues for posture, position in RW and sequence. pt with complaints of dizziness with gait, therefore limited distance. resolved with sitting down.       Cognition Arousal/Alertness: Awake/alert Behavior During Therapy: WFL for tasks assessed/performed Overall  Cognitive Status: Within Functional Limits for tasks assessed                      Exercises Total Joint Exercises Ankle Circles/Pumps: Supine;AROM;Both;10 reps Quad Sets: AROM;Strengthening;Left;10 reps;Supine Heel Slides: AAROM;Strengthening;Left;10 reps;Supine Hip ABduction/ADduction: AAROM;Strengthening;Left;10 reps;Supine     Pertinent Vitals/Pain Pain Assessment: 0-10 Pain Score: 8  Pain Location: left hip Pain Descriptors / Indicators: Aching;Sore;Discomfort;Grimacing;Tightness;Throbbing Pain Intervention(s): Limited activity within patient's tolerance;Monitored during session;Premedicated before session;Repositioned;Ice applied     PT Goals (current goals can now be found in the care plan section) Acute Rehab PT Goals Patient Stated Goal: return home and "keep this hip til I die" PT Goal Formulation: With patient Time For Goal Achievement: 12/07/14 Progress towards PT goals: Progressing toward goals    Frequency  7X/week    PT Plan Current plan remains appropriate    End of Session Equipment Utilized During Treatment: Gait belt Activity Tolerance: Patient tolerated treatment well Patient left: in chair;with call bell/phone within reach;with family/visitor present     Time: [id number] PT Time Calculation (min) (ACUTE ONLY): 24 min  Charges:  $Gait Training: 8-22 mins $Therapeutic Exercise: 8-22 mins                    Willow Ora 12/01/2014, 1:25 PM  Willow Ora, PTA, CLT Acute Rehab Services [phone] 12/01/2014, 1:26 PM

## 2014-12-01 NOTE — Progress Notes (Signed)
[facility] Progress Note Weekend Coverage  Subjective  Doing well C/o pain in L hip  Sitting in chair   Review of Systems  Constitutional: Negative for fever and chills.  Eyes: Negative for blurred vision.  Respiratory: Negative for shortness of breath and wheezing.   Cardiovascular: Negative for chest pain and palpitations.  Gastrointestinal: Negative for nausea and vomiting.  Neurological: Negative for tingling, sensory change and headaches.     Objective   BP 132/67 mmHg  Pulse 74  Temp(Src) 99.6 F (37.6 C) (Oral)  Resp 18  Wt 70.761 kg (156 lb)  SpO2 97%  Intake/Output      11/18 0701 - 11/19 0700 11/19 0701 - 11/20 0700   I.V. (mL/kg) 1540 (21.8)    Total Intake(mL/kg) 1540 (21.8)    Urine (mL/kg/hr) 0 (0)    Blood 300 (0.2)    Total Output 300     Net +1240          Urine Occurrence 4 x      Labs  Results for OSHA, RANE (MRN [id number]) as of 12/01/2014 11:43  Ref. Range 12/01/2014 02:40  Sodium Latest Ref Range: 135-145 mmol/L 135  Potassium Latest Ref Range: 3.5-5.1 mmol/L 3.7  Chloride Latest Ref Range: 101-111 mmol/L 101  CO2 Latest Ref Range: 22-32 mmol/L 24  BUN Latest Ref Range: 6-20 mg/dL 13  Creatinine Latest Ref Range: 0.44-1.00 mg/dL 0.76  Calcium Latest Ref Range: 8.9-10.3 mg/dL 8.8 (L)  EGFR (Non-African Amer.) Latest Ref Range: >60 mL/min >60  EGFR (African American) Latest Ref Range: >60 mL/min >60  Glucose Latest Ref Range: 65-99 mg/dL 118 (H)  Anion gap Latest Ref Range: 5-15  10  WBC Latest Ref Range: 4.0-10.5 K/uL 10.4  RBC Latest Ref Range: 3.87-5.11 MIL/uL 3.76 (L)  Hemoglobin Latest Ref Range: 12.0-15.0 g/dL 11.2 (L)  HCT Latest Ref Range: 36.0-46.0 % 34.2 (L)  MCV Latest Ref Range: 78.0-100.0 fL 91.0  MCH Latest Ref Range: 26.0-34.0 pg 29.8  MCHC Latest Ref Range: 30.0-36.0 g/dL 32.7  RDW Latest Ref Range: 11.5-15.5 % 13.5  Platelets Latest Ref Range: 150-400 K/uL 302    Exam  Gen: awake and  alert, resting comfortably in chair, NAD Lungs: clear anterior fields Cardiac: RRR, s1 and s2 Abd:+ BS, NTND Ext:       Left Lower Extremity   Dressing c/d/i  Ext warm   + DP pulse  DPN, SPN, TN sensation intact  EHL, FHL, AT, PT, peroneals, gastroc motor intact  No DCT    Assessment and Plan   POD/HD#: 1   69 y/o black female s/p L THA revision, poly exchange   1. L THA loosening with poly exchange   WBAT  Posterior hip precautions  PT/OT evals  Dressing change before dc   2. Pain management:  Will adjust as needed  3. ABL anemia/Hemodynamics  Stable  4. Medical issues   HTN   Home meds  5. DVT/PE prophylaxis:  eliquis  6. ID:   Completed periop abx    7. Activity:  Per #1  8. FEN/Foley/Lines:  Diet as tolerated  9. Dispo:  Possible dc home tomorrow if does well with therapies     [name], PA-C [facility] [phone] (O) 12/01/2014 11:47 AM

## 2014-12-01 NOTE — Progress Notes (Signed)
PT Cancellation Note  Patient Details Name: Rhonda Hart MRN: [id number] DOB: 17-Aug-1945   Cancelled Treatment:    Reason Eval/Treat Not Completed: Other (comment). Was on the way into see pt and spoke with OT who just completed a session with pt. OT reported pt was in extreme hip pain (8-9/10) despite receiving IV dilaudid recently and they only worked in bed. Will defer pm session until tomorrow when pt's pain is hopefully more controlled. RN aware and addressing this issue. Pt is up/down to bathroom on regular basis with nursing for continued mobility.    Willow Ora 12/01/2014, 4:00 PM   Willow Ora, PTA, CLT Acute Rehab Services [phone] 12/01/2014, 4:02 PM

## 2014-12-02 DIAGNOSIS — E785 Hyperlipidemia, unspecified: Secondary | ICD-10-CM | POA: Diagnosis present

## 2014-12-02 DIAGNOSIS — I10 Essential (primary) hypertension: Secondary | ICD-10-CM | POA: Diagnosis present

## 2014-12-02 LAB — CBC
HCT: 32.7 % — ABNORMAL LOW (ref 36.0–46.0)
HEMOGLOBIN: 10.6 g/dL — AB (ref 12.0–15.0)
MCH: 29.5 pg (ref 26.0–34.0)
MCHC: 32.4 g/dL (ref 30.0–36.0)
MCV: 91.1 fL (ref 78.0–100.0)
Platelets: 237 10*3/uL (ref 150–400)
RBC: 3.59 MIL/uL — AB (ref 3.87–5.11)
RDW: 13.7 % (ref 11.5–15.5)
WBC: 10.1 10*3/uL (ref 4.0–10.5)

## 2014-12-02 NOTE — Progress Notes (Signed)
Orthopaedic Trauma Service (OTS)   Subjective: Patient reports pain as mild.  Eager to go home.  Objective: Temp:  [98.6 F (37 C)-100.4 F (38 C)] 98.6 F (37 C) (11/20 0550) Pulse Rate:  [65-79] 79 (11/20 0447) Resp:  [18] 18 (11/20 0447) BP: (126-131)/(60-64) 131/64 mmHg (11/20 0447) SpO2:  [96 %-99 %] 96 % (11/20 0447) Physical Exam LLE Wound intact, clean, dry  Edema/ swelling controlled  Sens: DPN, SPN, TN intact  Motor: EHL, FHL, and lessor toe ext and flex all intact grossly  Brisk cap refill, warm to touch  Assessment/Plan: 2 Days Post-Op Procedure(s) (LRB): TOTAL LEFT HIP REVISION (Left) 1. Drsg changed 2. D/c to home today 3. Post hip prec 4. Eliquis 5. [place], MD [facility] [phone] (p)

## 2014-12-02 NOTE — Progress Notes (Signed)
Physical Therapy Treatment Patient Details Name: Rhonda Hart MRN: [id number] DOB: 1945/08/15 Today's Date: 12/02/2014    History of Present Illness pt admitted for L THA revision secondary to loosening hardware. Pt with prior L THA 19 years ago and 10 years ago. PMHx: HTN, hysterectomy    PT Comments    Pt making good progress toward goals today with increased activity tolerance and less overall assistance needed. Stair education completed today. Acute PT to continue.  Follow Up Recommendations  Home health PT     Equipment Recommendations  None recommended by PT    Precautions / Restrictions Precautions Precautions: Posterior Hip;Fall Precaution Comments: pt able to recall 2/3 hip precautions, re-educated on no IR.  Restrictions Weight Bearing Restrictions: Yes LLE Weight Bearing: Weight bearing as tolerated    Mobility  Bed Mobility         Supine to sit: Min assist     General bed mobility comments: with bed flat and no rails. min assist for left LE movement and control  Transfers Overall transfer level: Needs assistance Equipment used: Rolling walker (2 wheeled) Transfers: Sit to/from Stand Sit to Stand: Min guard         General transfer comment: from low bed /to recliner. cues on hand and left leg placement for safety.  Ambulation/Gait Ambulation/Gait assistance: Min guard;Supervision Ambulation Distance (Feet): 20 Feet Assistive device: Rolling walker (2 wheeled) Gait Pattern/deviations: Step-to pattern;Antalgic;Decreased step length - right;Decreased step length - left;Decreased stance time - left Gait velocity: decreased Gait velocity interpretation: Below normal speed for age/gender General Gait Details: cues on posture and to correct above listed gait deviations.   Stairs Stairs: Yes Stairs assistance: Min guard Stair Management: No rails;Forwards;Step to pattern Number of Stairs: 1 General stair comments: demo'd technique and then  provided cues on sequencing with pt performance.      Cognition Arousal/Alertness: Awake/alert Behavior During Therapy: WFL for tasks assessed/performed Overall Cognitive Status: Within Functional Limits for tasks assessed                      Exercises Total Joint Exercises Ankle Circles/Pumps: AROM;Both;10 reps;Supine Quad Sets: AROM;Strengthening;Both;10 reps;Supine Heel Slides: AAROM;Strengthening;Left;10 reps;Supine     Pertinent Vitals/Pain Pain Assessment: 0-10 Pain Score: 6  Pain Location: right hip Pain Descriptors / Indicators: Aching;Sore Pain Intervention(s): Monitored during session;Premedicated before session;Repositioned     PT Goals (current goals can now be found in the care plan section) Acute Rehab PT Goals Patient Stated Goal: return home and "keep this hip til I die" Time For Goal Achievement: 12/07/14 Potential to Achieve Goals: Good Progress towards PT goals: Progressing toward goals    Frequency  7X/week    PT Plan Current plan remains appropriate    End of Session Equipment Utilized During Treatment: Gait belt Activity Tolerance: Patient tolerated treatment well Patient left: in chair;with call bell/phone within reach     Time: 1134-1150 PT Time Calculation (min) (ACUTE ONLY): 16 min  Charges:  $Gait Training: 8-22 mins                    Willow Ora 12/02/2014, 12:26 PM   Willow Ora, PTA, [phone] 12/02/2014, 12:26 PM

## 2014-12-02 NOTE — Discharge Summary (Signed)
Orthopaedic Trauma Service (OTS)  Patient ID: [name] Rhonda Hart MRN: [id number] DOB/AGE: 1945-04-06 69 y.o.  Admit date: 11/30/2014 Discharge date: 12/02/2014  Admission Diagnoses: Failed L THA HTN Hyperlipidemia   Discharge Diagnoses:  Principal Problem:   Status post revision of total hip replacement Active Problems:   S/P revision of total hip   Hypertension   Hyperlipidemia   Procedures Performed: 11/30/2014- Dr. Katina Degree THA revision, poly exchange  Discharged Condition: good  Hospital Course:   69 y/o black female admitted on 11/30/2014 for procedure noted above. pts hospital stay uncomplicated. Worked well with therapy, no periop issues. Stable for dc on POD 2  Pt covered with eliquis for dvt/pe prophylaxis   Consults: None  Significant Diagnostic Studies: labs:   Results for Rhonda, Hart (MRN [id number]) as of 12/02/2014 11:53  Ref. Range 12/02/2014 03:15  WBC Latest Ref Range: 4.0-10.5 K/uL 10.1  RBC Latest Ref Range: 3.87-5.11 MIL/uL 3.59 (L)  Hemoglobin Latest Ref Range: 12.0-15.0 g/dL 10.6 (L)  HCT Latest Ref Range: 36.0-46.0 % 32.7 (L)  MCV Latest Ref Range: 78.0-100.0 fL 91.1  MCH Latest Ref Range: 26.0-34.0 pg 29.5  MCHC Latest Ref Range: 30.0-36.0 g/dL 32.4  RDW Latest Ref Range: 11.5-15.5 % 13.7  Platelets Latest Ref Range: 150-400 K/uL 237    Treatments: IV hydration, antibiotics: Ancef, analgesia: APAP, norco, oxy ir, dilaudid, anticoagulation: eliquis, therapies: PT, OT and RN and surgery: as above  Discharge Exam:               [facility]opaedic [facility]uma Service (OTS)   Subjective: Patient reports pain as mild.  Eager to go home.  Objective: Temp:  [98.6 F (37 C)-100.4 F (38 C)] 98.6 F (37 C) (11/20 0550) Pulse Rate:  [65-79] 79 (11/20 0447) Resp:  [18] 18 (11/20 0447) BP: (126-131)/(60-64) 131/64 mmHg (11/20 0447) SpO2:  [96 %-99 %] 96 % (11/20 0447) Physical Exam LLE      Wound intact, clean, dry             Edema/  swelling controlled             Sens: DPN, SPN, TN intact             Motor: EHL, FHL, and lessor toe ext and flex all intact grossly             Brisk cap refill, warm to touch  Assessment/Plan: 2 Days Post-Op Procedure(s) (LRB): TOTAL LEFT HIP REVISION (Left) 1. Drsg changed 2. D/c to home today 3. Post hip prec 4. Eliquis 5. Ellison Hughs, MD [facility], [facility] [phone] (p)     Disposition: 01-Home or Self Care     Medication List    STOP taking these medications        HYDROcodone-acetaminophen 7.5-325 MG tablet  Commonly known as:  NORCO     losartan-hydrochlorothiazide 100-25 MG tablet  Commonly known as:  HYZAAR      TAKE these medications        apixaban 2.5 MG Tabs tablet  Commonly known as:  ELIQUIS  Take 1 tablet (2.5 mg total) by mouth 2 (two) times daily.     aspirin EC 81 MG tablet  Take 1 tablet (81 mg total) by mouth daily.  Start taking on:  12/30/2014     hydrochlorothiazide 12.5 MG capsule  Commonly known as:  MICROZIDE  Take 12.5 mg by mouth daily.     metoprolol succinate 50  MG 24 hr tablet  Commonly known as:  TOPROL-XL  Take 50 mg by mouth daily.     oxyCODONE-acetaminophen 5-325 MG tablet  Commonly known as:  ROXICET  Take 1-2 tablets by mouth every 4 (four) hours as needed for severe pain.       Follow-up Information    Follow up with CAFFREY JR,W D, MD. Schedule an appointment as soon as possible for a visit in 2 weeks.   Specialty:  Orthopedic Surgery   Contact information:   [address] [place] [address] [phone]       Signed:  [name], PA-C [facility] [phone] (P) 12/02/2014, 11:51 AM

## 2014-12-02 NOTE — Progress Notes (Signed)
Occupational Therapy Treatment and Discharge Patient Details Name: Rhonda Hart MRN: [id number] DOB: April 28, 1945 Today's Date: 12/02/2014    History of present illness pt admitted for L THA revision secondary to loosening hardware. Pt with prior L THA 19 years ago and 10 years ago. PMHx: HTN, hysterectomy   OT comments  This 69 yo female admitted and underwent above presents to acute OT making progress towards goals. She will continue to benefit from [facility]. Pt to D/C today so will D/C from acute OT services.  Follow Up Recommendations  Home health OT;Supervision/Assistance - 24 hour    Equipment Recommendations  None recommended by OT       Precautions / Restrictions Precautions Precautions: Posterior Hip;Fall Restrictions Weight Bearing Restrictions: No LLE Weight Bearing: Weight bearing as tolerated       Mobility Bed Mobility Overal bed mobility: Needs Assistance Bed Mobility: Supine to Sit;Sit to Supine     Supine to sit: Min assist Sit to supine: Min assist   General bed mobility comments: HOB flat and not rails with A for RLE only  Transfers Overall transfer level: Needs assistance Equipment used: Rolling walker (2 wheeled) Transfers: Sit to/from Stand Sit to Stand: Min guard                  ADL Overall ADL's : Needs assistance/impaired                                   Tub/Shower Transfer Details (indicate cue type and reason): Took pt and family down to gym where tub is and demonstrated to them how they could use 3n1 as a shower chair. I offered pt to practice and she politely declined feeling that she understood. I also gave her and family a handout on this transfer technique.   General ADL Comments: family report they will A her with LB ADLs until she does not have hip precautions. 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 Pt and family now aware she  is to put RLE in LB clothing first then LLE.  Pt reports she is getting to/from 3n1 in bathroom with A without issues and family will A her at home.                 Cognition   Behavior During Therapy: WFL for tasks assessed/performed Overall Cognitive Status: Within Functional Limits for tasks assessed                                    Pertinent Vitals/ Pain       Pain Assessment: 0-10 Pain Score: 5  Pain Location: right hip Pain Descriptors / Indicators: Aching;Sore Pain Intervention(s): Monitored during session;Repositioned         Frequency Min 2X/week     Progress Toward Goals  OT Goals(current goals can now be found in the care plan section)  Progress towards OT goals: Progressing toward goals     Plan Discharge plan remains appropriate       End of Session Equipment Utilized During Treatment: Rolling walker   Activity Tolerance Patient tolerated treatment well   Patient Left in bed;with call bell/phone within reach;with bed alarm set;with family/visitor present   Nurse Communication  Time: [id number] OT Time Calculation (min): 39 min  Charges: OT General Charges $OT Visit: 1 Procedure OT Treatments $Self Care/Home Management : 38-52 mins  Almon Register [id number] 12/02/2014, 6:06 PM

## 2014-12-03 ENCOUNTER — Encounter (HOSPITAL_COMMUNITY): Payer: Self-pay | Admitting: Orthopedic Surgery

## 2014-12-03 LAB — WOUND CULTURE
CULTURE: NO GROWTH
GRAM STAIN: NONE SEEN

## 2014-12-03 NOTE — Op Note (Signed)
NAMEMarland Kitchen  ANTANAE, KOEPP NO.:  [id number]  MEDICAL RECORD NO.:  [id number]  LOCATION:  5N03C                        FACILITY:  [facility]  PHYSICIAN:  Lockie Pares, M.D.    DATE OF BIRTH:  1945/09/27  DATE OF PROCEDURE:  11/30/2014 DATE OF DISCHARGE:  12/02/2014                              OPERATIVE REPORT   PREOPERATIVE DIAGNOSIS:  Left acetabular polyethylene wear.  POSTOPERATIVE DIAGNOSIS:  Left acetabular polyethylene wear.  OPERATIONS: 1. Left total hip revision (acetabular component polyethylene with     femoral head exchange). 2. 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  SURGEON:  Lockie Pares, M.D.  ASSISTANT:  Chriss Czar, PA-C.  ANESTHESIA:  General anesthetic.  BLOOD LOSS:  Approximately 200.  DESCRIPTION OF PROCEDURE:  Lateral positioning with exposure to the old posterior exposure.  Careful protection of the posterior neurovascular structures was carried out, splitting the iliotibial band, identifying vastus ridge, which split the external rotators and the T-capsulotomy was made in the hip.  There was a moderate amount of macrophagic response to the polyethylene wear, requiring an extensive synovectomy around the hip for exposure.  We identified the poly with mild-to- moderate wear of the non-cross length poly.  This was extracted with poly extractor.  There was some erosion behind the cup with cup itself was very solid.  We removed the apex screw and actually packed bone graft behind the defect in the central area of the acetabulum.  We then trialed with a trial poly.  We elected to increase the neck length for stability sake to + 5.  This was trialed.  Excellent stability was noted with the trials in place relative to a flexion greater than 90, internal rotation to probably 60 and straight adduction with no tendency dislocate.  Wound was copiously irrigated.  Again, we did use the bone graft.  Final polyethylene was  placed and we elected to use a revision ceramic head 28 mm +5.  All parameters were again deemed to be acceptable relative stability. Closure of the iliotibial band and gluteus maximus fascia was with a running #2 Ethibond with 0 and 2-0 Vicryl, skin clips on the skin. Marcaine with epinephrine on the skin.  Lightly compressive sterile dressing was applied.  Taken to recovery room in stable condition.     Lockie Pares, M.D.     WDC/MEDQ  D:  11/30/2014  T:  11/30/2014  Job:  [id number]

## 2014-12-05 LAB — ANAEROBIC CULTURE: Gram Stain: NONE SEEN

## 2014-12-11 ENCOUNTER — Encounter (HOSPITAL_COMMUNITY): Payer: Self-pay | Admitting: Orthopedic Surgery

## 2014-12-13 DIAGNOSIS — M25552 Pain in left hip: Secondary | ICD-10-CM | POA: Diagnosis not present

## 2014-12-20 DIAGNOSIS — Z96641 Presence of right artificial hip joint: Secondary | ICD-10-CM | POA: Diagnosis not present

## 2014-12-20 DIAGNOSIS — Z7901 Long term (current) use of anticoagulants: Secondary | ICD-10-CM | POA: Diagnosis not present

## 2014-12-20 DIAGNOSIS — I10 Essential (primary) hypertension: Secondary | ICD-10-CM | POA: Diagnosis not present

## 2014-12-20 DIAGNOSIS — T84011D Broken internal left hip prosthesis, subsequent encounter: Secondary | ICD-10-CM | POA: Diagnosis not present

## 2014-12-24 DIAGNOSIS — T84011D Broken internal left hip prosthesis, subsequent encounter: Secondary | ICD-10-CM | POA: Diagnosis not present

## 2014-12-24 DIAGNOSIS — Z7901 Long term (current) use of anticoagulants: Secondary | ICD-10-CM | POA: Diagnosis not present

## 2014-12-24 DIAGNOSIS — Z96641 Presence of right artificial hip joint: Secondary | ICD-10-CM | POA: Diagnosis not present

## 2014-12-24 DIAGNOSIS — I10 Essential (primary) hypertension: Secondary | ICD-10-CM | POA: Diagnosis not present

## 2014-12-26 DIAGNOSIS — I10 Essential (primary) hypertension: Secondary | ICD-10-CM | POA: Diagnosis not present

## 2014-12-26 DIAGNOSIS — T84011D Broken internal left hip prosthesis, subsequent encounter: Secondary | ICD-10-CM | POA: Diagnosis not present

## 2014-12-26 DIAGNOSIS — Z96641 Presence of right artificial hip joint: Secondary | ICD-10-CM | POA: Diagnosis not present

## 2014-12-26 DIAGNOSIS — Z7901 Long term (current) use of anticoagulants: Secondary | ICD-10-CM | POA: Diagnosis not present

## 2014-12-28 DIAGNOSIS — Z7901 Long term (current) use of anticoagulants: Secondary | ICD-10-CM | POA: Diagnosis not present

## 2014-12-28 DIAGNOSIS — Z96641 Presence of right artificial hip joint: Secondary | ICD-10-CM | POA: Diagnosis not present

## 2014-12-28 DIAGNOSIS — I10 Essential (primary) hypertension: Secondary | ICD-10-CM | POA: Diagnosis not present

## 2014-12-28 DIAGNOSIS — T84011D Broken internal left hip prosthesis, subsequent encounter: Secondary | ICD-10-CM | POA: Diagnosis not present

## 2014-12-31 DIAGNOSIS — T84011D Broken internal left hip prosthesis, subsequent encounter: Secondary | ICD-10-CM | POA: Diagnosis not present

## 2014-12-31 DIAGNOSIS — Z96641 Presence of right artificial hip joint: Secondary | ICD-10-CM | POA: Diagnosis not present

## 2014-12-31 DIAGNOSIS — I10 Essential (primary) hypertension: Secondary | ICD-10-CM | POA: Diagnosis not present

## 2014-12-31 DIAGNOSIS — Z7901 Long term (current) use of anticoagulants: Secondary | ICD-10-CM | POA: Diagnosis not present

## 2015-01-02 DIAGNOSIS — I10 Essential (primary) hypertension: Secondary | ICD-10-CM | POA: Diagnosis not present

## 2015-01-02 DIAGNOSIS — Z96641 Presence of right artificial hip joint: Secondary | ICD-10-CM | POA: Diagnosis not present

## 2015-01-02 DIAGNOSIS — Z7901 Long term (current) use of anticoagulants: Secondary | ICD-10-CM | POA: Diagnosis not present

## 2015-01-02 DIAGNOSIS — T84011D Broken internal left hip prosthesis, subsequent encounter: Secondary | ICD-10-CM | POA: Diagnosis not present

## 2015-01-04 DIAGNOSIS — Z7901 Long term (current) use of anticoagulants: Secondary | ICD-10-CM | POA: Diagnosis not present

## 2015-01-04 DIAGNOSIS — Z96641 Presence of right artificial hip joint: Secondary | ICD-10-CM | POA: Diagnosis not present

## 2015-01-04 DIAGNOSIS — I10 Essential (primary) hypertension: Secondary | ICD-10-CM | POA: Diagnosis not present

## 2015-01-04 DIAGNOSIS — T84011D Broken internal left hip prosthesis, subsequent encounter: Secondary | ICD-10-CM | POA: Diagnosis not present

## 2015-01-07 DIAGNOSIS — T84011D Broken internal left hip prosthesis, subsequent encounter: Secondary | ICD-10-CM | POA: Diagnosis not present

## 2015-01-07 DIAGNOSIS — Z7901 Long term (current) use of anticoagulants: Secondary | ICD-10-CM | POA: Diagnosis not present

## 2015-01-07 DIAGNOSIS — I10 Essential (primary) hypertension: Secondary | ICD-10-CM | POA: Diagnosis not present

## 2015-01-07 DIAGNOSIS — Z96641 Presence of right artificial hip joint: Secondary | ICD-10-CM | POA: Diagnosis not present

## 2015-01-09 DIAGNOSIS — Z96641 Presence of right artificial hip joint: Secondary | ICD-10-CM | POA: Diagnosis not present

## 2015-01-09 DIAGNOSIS — T84011D Broken internal left hip prosthesis, subsequent encounter: Secondary | ICD-10-CM | POA: Diagnosis not present

## 2015-01-09 DIAGNOSIS — Z7901 Long term (current) use of anticoagulants: Secondary | ICD-10-CM | POA: Diagnosis not present

## 2015-01-09 DIAGNOSIS — I10 Essential (primary) hypertension: Secondary | ICD-10-CM | POA: Diagnosis not present

## 2015-01-14 DIAGNOSIS — I10 Essential (primary) hypertension: Secondary | ICD-10-CM | POA: Diagnosis not present

## 2015-01-14 DIAGNOSIS — Z96641 Presence of right artificial hip joint: Secondary | ICD-10-CM | POA: Diagnosis not present

## 2015-01-14 DIAGNOSIS — T84011D Broken internal left hip prosthesis, subsequent encounter: Secondary | ICD-10-CM | POA: Diagnosis not present

## 2015-01-14 DIAGNOSIS — Z7901 Long term (current) use of anticoagulants: Secondary | ICD-10-CM | POA: Diagnosis not present

## 2015-01-17 DIAGNOSIS — T84039D Mechanical loosening of unspecified internal prosthetic joint, subsequent encounter: Secondary | ICD-10-CM | POA: Diagnosis not present

## 2015-01-18 DIAGNOSIS — I10 Essential (primary) hypertension: Secondary | ICD-10-CM | POA: Diagnosis not present

## 2015-01-18 DIAGNOSIS — Z7901 Long term (current) use of anticoagulants: Secondary | ICD-10-CM | POA: Diagnosis not present

## 2015-01-18 DIAGNOSIS — Z96641 Presence of right artificial hip joint: Secondary | ICD-10-CM | POA: Diagnosis not present

## 2015-01-18 DIAGNOSIS — T84011D Broken internal left hip prosthesis, subsequent encounter: Secondary | ICD-10-CM | POA: Diagnosis not present

## 2015-01-22 DIAGNOSIS — Z7901 Long term (current) use of anticoagulants: Secondary | ICD-10-CM | POA: Diagnosis not present

## 2015-01-22 DIAGNOSIS — T84011D Broken internal left hip prosthesis, subsequent encounter: Secondary | ICD-10-CM | POA: Diagnosis not present

## 2015-01-22 DIAGNOSIS — Z96641 Presence of right artificial hip joint: Secondary | ICD-10-CM | POA: Diagnosis not present

## 2015-01-22 DIAGNOSIS — I10 Essential (primary) hypertension: Secondary | ICD-10-CM | POA: Diagnosis not present

## 2015-01-24 DIAGNOSIS — T84011D Broken internal left hip prosthesis, subsequent encounter: Secondary | ICD-10-CM | POA: Diagnosis not present

## 2015-01-24 DIAGNOSIS — I10 Essential (primary) hypertension: Secondary | ICD-10-CM | POA: Diagnosis not present

## 2015-01-24 DIAGNOSIS — Z7901 Long term (current) use of anticoagulants: Secondary | ICD-10-CM | POA: Diagnosis not present

## 2015-01-24 DIAGNOSIS — Z96641 Presence of right artificial hip joint: Secondary | ICD-10-CM | POA: Diagnosis not present

## 2015-01-28 DIAGNOSIS — T84011D Broken internal left hip prosthesis, subsequent encounter: Secondary | ICD-10-CM | POA: Diagnosis not present

## 2015-01-28 DIAGNOSIS — I10 Essential (primary) hypertension: Secondary | ICD-10-CM | POA: Diagnosis not present

## 2015-01-28 DIAGNOSIS — Z96641 Presence of right artificial hip joint: Secondary | ICD-10-CM | POA: Diagnosis not present

## 2015-01-28 DIAGNOSIS — Z7901 Long term (current) use of anticoagulants: Secondary | ICD-10-CM | POA: Diagnosis not present

## 2015-01-30 DIAGNOSIS — I10 Essential (primary) hypertension: Secondary | ICD-10-CM | POA: Diagnosis not present

## 2015-01-30 DIAGNOSIS — Z96641 Presence of right artificial hip joint: Secondary | ICD-10-CM | POA: Diagnosis not present

## 2015-01-30 DIAGNOSIS — Z7901 Long term (current) use of anticoagulants: Secondary | ICD-10-CM | POA: Diagnosis not present

## 2015-01-30 DIAGNOSIS — T84011D Broken internal left hip prosthesis, subsequent encounter: Secondary | ICD-10-CM | POA: Diagnosis not present

## 2015-02-04 DIAGNOSIS — Z7901 Long term (current) use of anticoagulants: Secondary | ICD-10-CM | POA: Diagnosis not present

## 2015-02-04 DIAGNOSIS — Z96641 Presence of right artificial hip joint: Secondary | ICD-10-CM | POA: Diagnosis not present

## 2015-02-04 DIAGNOSIS — T84011D Broken internal left hip prosthesis, subsequent encounter: Secondary | ICD-10-CM | POA: Diagnosis not present

## 2015-02-04 DIAGNOSIS — I10 Essential (primary) hypertension: Secondary | ICD-10-CM | POA: Diagnosis not present

## 2015-02-06 DIAGNOSIS — Z7901 Long term (current) use of anticoagulants: Secondary | ICD-10-CM | POA: Diagnosis not present

## 2015-02-06 DIAGNOSIS — I10 Essential (primary) hypertension: Secondary | ICD-10-CM | POA: Diagnosis not present

## 2015-02-06 DIAGNOSIS — Z96641 Presence of right artificial hip joint: Secondary | ICD-10-CM | POA: Diagnosis not present

## 2015-02-06 DIAGNOSIS — T84011D Broken internal left hip prosthesis, subsequent encounter: Secondary | ICD-10-CM | POA: Diagnosis not present

## 2015-02-12 DIAGNOSIS — Z96641 Presence of right artificial hip joint: Secondary | ICD-10-CM | POA: Diagnosis not present

## 2015-02-12 DIAGNOSIS — Z7901 Long term (current) use of anticoagulants: Secondary | ICD-10-CM | POA: Diagnosis not present

## 2015-02-12 DIAGNOSIS — I10 Essential (primary) hypertension: Secondary | ICD-10-CM | POA: Diagnosis not present

## 2015-02-12 DIAGNOSIS — T84011D Broken internal left hip prosthesis, subsequent encounter: Secondary | ICD-10-CM | POA: Diagnosis not present

## 2015-02-14 DIAGNOSIS — I10 Essential (primary) hypertension: Secondary | ICD-10-CM | POA: Diagnosis not present

## 2015-02-14 DIAGNOSIS — Z7901 Long term (current) use of anticoagulants: Secondary | ICD-10-CM | POA: Diagnosis not present

## 2015-02-14 DIAGNOSIS — Z96641 Presence of right artificial hip joint: Secondary | ICD-10-CM | POA: Diagnosis not present

## 2015-02-14 DIAGNOSIS — T84011D Broken internal left hip prosthesis, subsequent encounter: Secondary | ICD-10-CM | POA: Diagnosis not present

## 2015-02-28 DIAGNOSIS — T84039D Mechanical loosening of unspecified internal prosthetic joint, subsequent encounter: Secondary | ICD-10-CM | POA: Diagnosis not present

## 2015-03-18 DIAGNOSIS — M1710 Unilateral primary osteoarthritis, unspecified knee: Secondary | ICD-10-CM | POA: Diagnosis not present

## 2015-03-18 DIAGNOSIS — I10 Essential (primary) hypertension: Secondary | ICD-10-CM | POA: Diagnosis not present

## 2015-03-18 DIAGNOSIS — F17290 Nicotine dependence, other tobacco product, uncomplicated: Secondary | ICD-10-CM | POA: Diagnosis not present

## 2015-03-26 DIAGNOSIS — M1711 Unilateral primary osteoarthritis, right knee: Secondary | ICD-10-CM | POA: Diagnosis not present

## 2015-03-26 DIAGNOSIS — T84039D Mechanical loosening of unspecified internal prosthetic joint, subsequent encounter: Secondary | ICD-10-CM | POA: Diagnosis not present

## 2015-03-27 ENCOUNTER — Ambulatory Visit: Payer: Self-pay | Admitting: Physician Assistant

## 2015-03-27 NOTE — H&P (Signed)
TOTAL KNEE ADMISSION H&P  Patient is being admitted for right total knee arthroplasty.  Subjective:  Chief Complaint:right knee pain.  HPI: Rhonda Hart, 70 y.o. female, has a history of pain and functional disability in the right knee due to arthritis and has failed non-surgical conservative treatments for greater than 12 weeks to includeNSAID's and/or analgesics, corticosteriod injections, use of assistive devices and activity modification.  Onset of symptoms was gradual, starting 3 years ago with rapidlly worsening course since that time. The patient noted no past surgery on the right knee(s).  Patient currently rates pain in the right knee(s) at 10 out of 10 with activity. Patient has night pain, worsening of pain with activity and weight bearing, pain that interferes with activities of daily living, pain with passive range of motion, crepitus and joint swelling.  Patient has evidence of periarticular osteophytes and joint space narrowing by imaging studies.There is no active infection.  Patient Active Problem List   Diagnosis Date Noted  . Hypertension   . Hyperlipidemia   . 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 Hypertension     takes Hyzaar,HCTZ,and Metoprolol daily  . Hyperlipidemia     has never been on meds  . Arthritis   . Joint pain   . History of colon polyps     benign    Past Surgical History  Procedure Laterality Date  . Abdominal hysterectomy    . Total hip arthroplasty      x 2 on right and x 1 on left  . Cesarean section    . Colonosocpy    . Total hip revision Left 11/30/2014    Procedure: TOTAL LEFT HIP REVISION;  Surgeon: Earlie Server, MD;  Location: [facility];  Service: Orthopedics;  Laterality: Left;     (Not in a hospital admission) Allergies  Allergen Reactions  . Coumadin [Warfarin Sodium] Rash    Social History  Substance Use Topics  .  Smoking status: Never Smoker   . Smokeless tobacco: Not on file  . Alcohol Use: No    No family history on file.   Review of Systems  Musculoskeletal: Positive for joint pain.  All other systems reviewed and are negative.   Objective:  Physical Exam  Constitutional: She is oriented to person, place, and time. She appears well-developed and well-nourished. No distress.  HENT:  Head: Normocephalic and atraumatic.  Nose: Nose normal.  Eyes: Conjunctivae and EOM are normal. Pupils are equal, round, and reactive to light.  Neck: Normal range of motion. Neck supple.  Cardiovascular: Normal rate, regular rhythm, normal heart sounds and intact distal pulses.   Respiratory: Effort normal and breath sounds normal. No respiratory distress. She has no wheezes.  GI: Soft. Bowel sounds are normal. She exhibits no distension. There is no tenderness.  Musculoskeletal:       Right knee: She exhibits swelling. She exhibits no erythema, no LCL laxity and no MCL laxity. Tenderness found.  Lymphadenopathy:    She has no cervical adenopathy.  Neurological: She is alert and oriented to person, place, and time. No cranial nerve deficit.  Skin: Skin is warm and dry. No rash noted. No erythema.  Psychiatric: She has a normal mood and affect. Her behavior is normal.    Vital signs in last 24 hours: @VSRANGES @  Labs:   Estimated body mass index is 29.51 kg/(m^2) as calculated from the  following:   Height as of 11/20/14: 5\' 1"  (1.549 m).   Weight as of 11/20/14: 70.806 kg (156 lb 1.6 oz).   Imaging Review Plain radiographs demonstrate severe degenerative joint disease of the right knee(s). The overall alignment issignificant valgus. The bone quality appears to be good for age and reported activity level.  Assessment/Plan:  End stage arthritis, right knee   The patient history, physical examination, clinical judgment of the provider and imaging studies are consistent with end stage degenerative  joint disease of the right knee(s) and total knee arthroplasty is deemed medically necessary. The treatment options including medical management, injection therapy arthroscopy and arthroplasty were discussed at length. The risks and benefits of total knee arthroplasty were presented and reviewed. The risks due to aseptic loosening, infection, stiffness, patella tracking problems, thromboembolic complications and other imponderables were discussed. The patient acknowledged the explanation, agreed to proceed with the plan and consent was signed. Patient is being admitted for inpatient treatment for surgery, pain control, PT, OT, prophylactic antibiotics, VTE prophylaxis, progressive ambulation and ADL's and discharge planning. The patient is planning to be discharged home with home health services

## 2015-04-04 DIAGNOSIS — M19049 Primary osteoarthritis, unspecified hand: Secondary | ICD-10-CM | POA: Diagnosis not present

## 2015-04-04 DIAGNOSIS — I10 Essential (primary) hypertension: Secondary | ICD-10-CM | POA: Diagnosis not present

## 2015-04-04 DIAGNOSIS — F17290 Nicotine dependence, other tobacco product, uncomplicated: Secondary | ICD-10-CM | POA: Diagnosis not present

## 2015-04-08 ENCOUNTER — Encounter (HOSPITAL_COMMUNITY)
Admission: RE | Admit: 2015-04-08 | Discharge: 2015-04-08 | Disposition: A | Discharge status: Home / Self Care | Payer: Commercial Managed Care - HMO | Source: Ambulatory Visit | Attending: Orthopedic Surgery | Admitting: Orthopedic Surgery

## 2015-04-08 ENCOUNTER — Encounter (HOSPITAL_COMMUNITY): Payer: Self-pay

## 2015-04-08 DIAGNOSIS — Z79899 Other long term (current) drug therapy: Secondary | ICD-10-CM | POA: Diagnosis not present

## 2015-04-08 DIAGNOSIS — Z7982 Long term (current) use of aspirin: Secondary | ICD-10-CM | POA: Insufficient documentation

## 2015-04-08 DIAGNOSIS — Z01818 Encounter for other preprocedural examination: Secondary | ICD-10-CM | POA: Insufficient documentation

## 2015-04-08 DIAGNOSIS — Z0183 Encounter for blood typing: Secondary | ICD-10-CM | POA: Insufficient documentation

## 2015-04-08 DIAGNOSIS — E785 Hyperlipidemia, unspecified: Secondary | ICD-10-CM | POA: Insufficient documentation

## 2015-04-08 DIAGNOSIS — M1711 Unilateral primary osteoarthritis, right knee: Secondary | ICD-10-CM | POA: Insufficient documentation

## 2015-04-08 DIAGNOSIS — Z01812 Encounter for preprocedural laboratory examination: Secondary | ICD-10-CM | POA: Diagnosis not present

## 2015-04-08 DIAGNOSIS — I10 Essential (primary) hypertension: Secondary | ICD-10-CM | POA: Insufficient documentation

## 2015-04-08 LAB — URINALYSIS, ROUTINE W REFLEX MICROSCOPIC
Bilirubin Urine: NEGATIVE
Glucose, UA: NEGATIVE mg/dL
Hgb urine dipstick: NEGATIVE
KETONES UR: NEGATIVE mg/dL
LEUKOCYTES UA: NEGATIVE
NITRITE: NEGATIVE
PH: 6 (ref 5.0–8.0)
Protein, ur: NEGATIVE mg/dL
SPECIFIC GRAVITY, URINE: 1.007 (ref 1.005–1.030)

## 2015-04-08 LAB — CBC WITH DIFFERENTIAL/PLATELET
Basophils Absolute: 0 10*3/uL (ref 0.0–0.1)
Basophils Relative: 0 %
Eosinophils Absolute: 0 10*3/uL (ref 0.0–0.7)
Eosinophils Relative: 1 %
HEMATOCRIT: 35.6 % — AB (ref 36.0–46.0)
HEMOGLOBIN: 11.6 g/dL — AB (ref 12.0–15.0)
LYMPHS ABS: 2.3 10*3/uL (ref 0.7–4.0)
Lymphocytes Relative: 35 %
MCH: 29.4 pg (ref 26.0–34.0)
MCHC: 32.6 g/dL (ref 30.0–36.0)
MCV: 90.4 fL (ref 78.0–100.0)
MONO ABS: 0.3 10*3/uL (ref 0.1–1.0)
MONOS PCT: 5 %
NEUTROS ABS: 3.8 10*3/uL (ref 1.7–7.7)
NEUTROS PCT: 59 %
Platelets: 268 10*3/uL (ref 150–400)
RBC: 3.94 MIL/uL (ref 3.87–5.11)
RDW: 14.1 % (ref 11.5–15.5)
WBC: 6.5 10*3/uL (ref 4.0–10.5)

## 2015-04-08 LAB — COMPREHENSIVE METABOLIC PANEL
ALT: 16 U/L (ref 14–54)
AST: 19 U/L (ref 15–41)
Albumin: 3.8 g/dL (ref 3.5–5.0)
Alkaline Phosphatase: 87 U/L (ref 38–126)
Anion gap: 8 (ref 5–15)
BILIRUBIN TOTAL: 0.6 mg/dL (ref 0.3–1.2)
BUN: 11 mg/dL (ref 6–20)
CO2: 23 mmol/L (ref 22–32)
CREATININE: 0.71 mg/dL (ref 0.44–1.00)
Calcium: 9.5 mg/dL (ref 8.9–10.3)
Chloride: 110 mmol/L (ref 101–111)
GFR calc Af Amer: >60 mL/min (ref >60)
GLUCOSE: 83 mg/dL (ref 65–99)
Potassium: 4.1 mmol/L (ref 3.5–5.1)
Sodium: 141 mmol/L (ref 135–145)
TOTAL PROTEIN: 7.8 g/dL (ref 6.5–8.1)

## 2015-04-08 LAB — SURGICAL PCR SCREEN
MRSA, PCR: NEGATIVE
STAPHYLOCOCCUS AUREUS: NEGATIVE

## 2015-04-08 LAB — PROTIME-INR
INR: 1.07 (ref 0.00–1.49)
PROTHROMBIN TIME: 14.1 s (ref 11.6–15.2)

## 2015-04-08 LAB — TYPE AND SCREEN
ABO/RH(D): A POS
Antibody Screen: NEGATIVE

## 2015-04-08 LAB — APTT: aPTT: 35 seconds (ref 24–37)

## 2015-04-08 NOTE — Pre-Procedure Instructions (Signed)
Rhonda Hart  04/08/2015      [facility] [place] [id number] [place]) [place] [address] Phone: [phone] Fax: [phone]    Your procedure is scheduled on Friday April 7th.  Report to [facility] Admitting at 830 A.M.  Call this number if you have problems the morning of surgery:  [phone]   Remember:  Do not eat food or drink liquids after midnight.  Take these medicines the morning of surgery with A SIP OF WATER amlodipine (norvasc), metoprolol succinate (toprol-xl)  STOP: ALL Vitamins, Supplements, Effient and Herbal Medications, Fish Oils, Aspirins, NSAIDs (Nonsteroidal Anti-inflammatories such as Ibuprofen, Aleve, or Advil), and Goody's/BC Powders 7 days prior to surgery, until after surgery as directed by your physician.    Do not wear jewelry, make-up or nail polish.  Do not wear lotions, powders, or perfumes.  You may wear deodorant.  Do not shave 48 hours prior to surgery.  Men may shave face and neck.  Do not bring valuables to the hospital.  [facility] is not responsible for any belongings or valuables.  Contacts, dentures or bridgework may not be worn into surgery.  Leave your suitcase in the car.  After surgery it may be brought to your room.  For patients admitted to the hospital, discharge time will be determined by your treatment team.  Patients discharged the day of surgery will not be allowed to drive home.        Preparing for Surgery at [facility]  Before surgery, you can play an important role.  Because skin is not sterile, your skin needs to be as free of germs as possible.  You can reduce the number of germs on your skin by washing with CHG (chlorahexidine gluconate) Soap before surgery.  CHG is an antiseptic cleaner with kills germs and bonds with the skin to continue killing germs even after washing.   Please do not use if you have an allergy to CHG or antibacterial  soaps.  If your skin becomes reddened/irritated stop using the CHG.  Do not shave (including legs and underarms) for at least 48 hours prior to first CHG shower.  It is okay to shave your face.  Please follow these instructions carefully:  1. Shower with CHG Soap the night before surgery and the morning of Surgery. 2. If you choose to wash your hair, wash your hair first as usual with your normal shampoo. 3. After you shampoo, rinse your hair and body thoroughly to remove the Shampoo. 4. Use CHG as you would any other liquid soap. You can apply chg directly to the skin and wash gently with scrungie or a clean washcloth. 5. Apply the CHG Soap to your body ONLY FROM THE NECK DOWN. Do not use on open wounds or open sores. Avoid contact with your eyes, ears, mouth and genitals (private parts). Wash genitals (private parts) with your normal soap. 6. Wash thoroughly, paying special attention to the area where your surgery will be performed. 7. Thoroughly rinse your body with warm water from the neck down. 8. DO NOT shower/wash with your normal soap after using and rinsing off the CHG Soap. 9. Pat yourself dry with a clean towel.  10. Wear clean pajamas.  11. Place clean sheets on your bed the night of your first shower and do not sleep with pets.  Day of Surgery  Do not apply any lotions/deodorants the morning of  surgery. Please wear clean clothes to the hospital/surgery center.   Please read over the following fact sheets that you were given. Pain Booklet, Coughing and Deep Breathing, Blood Transfusion Information, Total Joint Packet, MRSA Information and Surgical Site Infection Prevention

## 2015-04-10 NOTE — Progress Notes (Signed)
Anesthesia Chart Review:  Pt is a 70 year old female scheduled for R total knee arthroplasty on 04/19/2015 with Dr. French Ana.   PMH includes:  HTN, hyperlipidemia. Never smoker. BMI 31.5. S/p L hip revision 11/30/14.   Medications include: amlodipine, ASA, losartan-hctz, metoprolol.   Preoperative labs reviewed.    Chest x-ray 11/20/14 reviewed. No active cardiopulmonary disease.   EKG 11/20/14: Sinus bradycardia (53 bpm). Minimal voltage criteria for LVH, may be normal variant  Nuclear stress test 05/19/13: Normal myocardial perfusion study with left ventricular ejection fraction calculated at 58%  If no changes, I anticipate pt can proceed with surgery as scheduled.   Willeen Cass, FNP-BC [facility]/Anesthesiology Phone: [phone] 04/10/2015 10:23 AM

## 2015-04-18 MED ORDER — TRANEXAMIC ACID 1000 MG/10ML IV SOLN
1000.0000 mg | INTRAVENOUS | Status: AC
  Administered 2015-04-19: 1000 mg via INTRAVENOUS
  Filled 2015-04-18: qty 10

## 2015-04-18 MED ORDER — CEFAZOLIN SODIUM-DEXTROSE 2-4 GM/100ML-% IV SOLN
2.0000 g | INTRAVENOUS | Status: AC
  Administered 2015-04-19: 2 g via INTRAVENOUS
  Filled 2015-04-18: qty 100

## 2015-04-18 MED ORDER — SODIUM CHLORIDE 0.9 % IV SOLN: INTRAVENOUS | Status: DC

## 2015-04-19 ENCOUNTER — Inpatient Hospital Stay (HOSPITAL_COMMUNITY): Payer: Commercial Managed Care - HMO | Admitting: Certified Registered"

## 2015-04-19 ENCOUNTER — Inpatient Hospital Stay (HOSPITAL_COMMUNITY)
Admission: RE | Admit: 2015-04-19 | Discharge: 2015-04-22 | DRG: 470 | Disposition: A | Discharge status: Home Health | Payer: Commercial Managed Care - HMO | Source: Ambulatory Visit | Attending: Orthopedic Surgery | Admitting: Orthopedic Surgery

## 2015-04-19 ENCOUNTER — Encounter (HOSPITAL_COMMUNITY): Payer: Self-pay | Admitting: Surgery

## 2015-04-19 ENCOUNTER — Encounter (HOSPITAL_COMMUNITY)
Admission: RE | Disposition: A | Discharge status: Home Health | Payer: Self-pay | Source: Ambulatory Visit | Attending: Orthopedic Surgery

## 2015-04-19 ENCOUNTER — Inpatient Hospital Stay (HOSPITAL_COMMUNITY): Payer: Commercial Managed Care - HMO | Admitting: Emergency Medicine

## 2015-04-19 DIAGNOSIS — G8918 Other acute postprocedural pain: Secondary | ICD-10-CM | POA: Diagnosis not present

## 2015-04-19 DIAGNOSIS — R339 Retention of urine, unspecified: Secondary | ICD-10-CM | POA: Diagnosis present

## 2015-04-19 DIAGNOSIS — Z888 Allergy status to other drugs, medicaments and biological substances status: Secondary | ICD-10-CM | POA: Diagnosis not present

## 2015-04-19 DIAGNOSIS — I10 Essential (primary) hypertension: Secondary | ICD-10-CM | POA: Diagnosis not present

## 2015-04-19 DIAGNOSIS — M25561 Pain in right knee: Secondary | ICD-10-CM | POA: Diagnosis present

## 2015-04-19 DIAGNOSIS — N390 Urinary tract infection, site not specified: Secondary | ICD-10-CM | POA: Diagnosis not present

## 2015-04-19 DIAGNOSIS — D62 Acute posthemorrhagic anemia: Secondary | ICD-10-CM | POA: Diagnosis not present

## 2015-04-19 DIAGNOSIS — Z96643 Presence of artificial hip joint, bilateral: Secondary | ICD-10-CM | POA: Diagnosis not present

## 2015-04-19 DIAGNOSIS — M21061 Valgus deformity, not elsewhere classified, right knee: Secondary | ICD-10-CM | POA: Diagnosis not present

## 2015-04-19 DIAGNOSIS — M1711 Unilateral primary osteoarthritis, right knee: Secondary | ICD-10-CM | POA: Diagnosis not present

## 2015-04-19 DIAGNOSIS — M179 Osteoarthritis of knee, unspecified: Secondary | ICD-10-CM | POA: Diagnosis not present

## 2015-04-19 DIAGNOSIS — Z96651 Presence of right artificial knee joint: Secondary | ICD-10-CM | POA: Diagnosis not present

## 2015-04-19 HISTORY — PX: TOTAL KNEE ARTHROPLASTY: SHX125

## 2015-04-19 SURGERY — ARTHROPLASTY, KNEE, TOTAL
Anesthesia: Regional | Site: Knee | Laterality: Right

## 2015-04-19 MED ORDER — PROPOFOL 10 MG/ML IV BOLUS
INTRAVENOUS | Status: DC | PRN
  Administered 2015-04-19: 20 mg via INTRAVENOUS
  Administered 2015-04-19: 150 mg via INTRAVENOUS
  Administered 2015-04-19: 10 mg via INTRAVENOUS
  Administered 2015-04-19: 30 mg via INTRAVENOUS
  Administered 2015-04-19: 20 mg via INTRAVENOUS

## 2015-04-19 MED ORDER — LOSARTAN POTASSIUM-HCTZ 100-25 MG PO TABS: 1.0000 | ORAL_TABLET | Freq: Every day | ORAL | Status: DC

## 2015-04-19 MED ORDER — FENTANYL CITRATE (PF) 100 MCG/2ML IJ SOLN
INTRAMUSCULAR | Status: AC
  Administered 2015-04-19: 50 ug via INTRAVENOUS
  Filled 2015-04-19: qty 2

## 2015-04-19 MED ORDER — BUPIVACAINE-EPINEPHRINE (PF) 0.5% -1:200000 IJ SOLN
INTRAMUSCULAR | Status: DC | PRN
  Administered 2015-04-19: 30 mL via PERINEURAL

## 2015-04-19 MED ORDER — MIDAZOLAM HCL 2 MG/2ML IJ SOLN
INTRAMUSCULAR | Status: AC
  Filled 2015-04-19: qty 2

## 2015-04-19 MED ORDER — APIXABAN 2.5 MG PO TABS: 2.5000 mg | ORAL_TABLET | Freq: Two times a day (BID) | ORAL | Status: DC

## 2015-04-19 MED ORDER — APIXABAN 2.5 MG PO TABS
2.5000 mg | ORAL_TABLET | Freq: Two times a day (BID) | ORAL | Status: DC
  Administered 2015-04-20 – 2015-04-22 (×5): 2.5 mg via ORAL
  Filled 2015-04-19 (×5): qty 1

## 2015-04-19 MED ORDER — ROCURONIUM BROMIDE 50 MG/5ML IV SOLN
INTRAVENOUS | Status: AC
  Filled 2015-04-19: qty 1

## 2015-04-19 MED ORDER — EPHEDRINE SULFATE 50 MG/ML IJ SOLN
INTRAMUSCULAR | Status: AC
  Filled 2015-04-19: qty 1

## 2015-04-19 MED ORDER — ACETAMINOPHEN 10 MG/ML IV SOLN
INTRAVENOUS | Status: AC
  Filled 2015-04-19: qty 100

## 2015-04-19 MED ORDER — ACETAMINOPHEN 500 MG PO TABS
1000.0000 mg | ORAL_TABLET | Freq: Four times a day (QID) | ORAL | Status: AC
  Administered 2015-04-19 – 2015-04-20 (×3): 1000 mg via ORAL
  Filled 2015-04-19 (×3): qty 2

## 2015-04-19 MED ORDER — LIDOCAINE HCL (CARDIAC) 20 MG/ML IV SOLN
INTRAVENOUS | Status: DC | PRN
  Administered 2015-04-19: 50 mg via INTRAVENOUS

## 2015-04-19 MED ORDER — BUPIVACAINE-EPINEPHRINE (PF) 0.5% -1:200000 IJ SOLN
INTRAMUSCULAR | Status: AC
  Filled 2015-04-19: qty 30

## 2015-04-19 MED ORDER — PROPOFOL 10 MG/ML IV BOLUS
INTRAVENOUS | Status: AC
  Filled 2015-04-19: qty 20

## 2015-04-19 MED ORDER — NEOSTIGMINE METHYLSULFATE 10 MG/10ML IV SOLN
INTRAVENOUS | Status: DC | PRN
  Administered 2015-04-19: 3.5 mg via INTRAVENOUS

## 2015-04-19 MED ORDER — GLYCOPYRROLATE 0.2 MG/ML IJ SOLN
INTRAMUSCULAR | Status: AC
  Filled 2015-04-19: qty 5

## 2015-04-19 MED ORDER — ONDANSETRON HCL 4 MG/2ML IJ SOLN
INTRAMUSCULAR | Status: DC | PRN
  Administered 2015-04-19: 4 mg via INTRAVENOUS

## 2015-04-19 MED ORDER — OXYCODONE HCL 5 MG PO TABS
ORAL_TABLET | ORAL | Status: AC
  Filled 2015-04-19: qty 2

## 2015-04-19 MED ORDER — CEFAZOLIN SODIUM 1-5 GM-% IV SOLN
1.0000 g | Freq: Four times a day (QID) | INTRAVENOUS | Status: AC
  Administered 2015-04-19 – 2015-04-20 (×2): 1 g via INTRAVENOUS
  Filled 2015-04-19 (×3): qty 50

## 2015-04-19 MED ORDER — LIDOCAINE HCL (CARDIAC) 20 MG/ML IV SOLN
INTRAVENOUS | Status: AC
  Filled 2015-04-19: qty 5

## 2015-04-19 MED ORDER — MIDAZOLAM HCL 2 MG/2ML IJ SOLN
INTRAMUSCULAR | Status: AC
  Administered 2015-04-19: 2 mg
  Filled 2015-04-19: qty 2

## 2015-04-19 MED ORDER — KETOROLAC TROMETHAMINE 30 MG/ML IJ SOLN
30.0000 mg | Freq: Once | INTRAMUSCULAR | Status: AC
  Administered 2015-04-19: 30 mg via INTRAVENOUS

## 2015-04-19 MED ORDER — ONDANSETRON HCL 4 MG/2ML IJ SOLN
INTRAMUSCULAR | Status: AC
  Filled 2015-04-19: qty 2

## 2015-04-19 MED ORDER — OXYCODONE HCL 5 MG PO TABS
5.0000 mg | ORAL_TABLET | ORAL | Status: DC | PRN
  Administered 2015-04-19 – 2015-04-22 (×9): 10 mg via ORAL
  Filled 2015-04-19 (×8): qty 2

## 2015-04-19 MED ORDER — NEOSTIGMINE METHYLSULFATE 10 MG/10ML IV SOLN
INTRAVENOUS | Status: AC
  Filled 2015-04-19: qty 1

## 2015-04-19 MED ORDER — LACTATED RINGERS IV SOLN
INTRAVENOUS | Status: DC
  Administered 2015-04-19 (×2): via INTRAVENOUS

## 2015-04-19 MED ORDER — FENTANYL CITRATE (PF) 100 MCG/2ML IJ SOLN
50.0000 ug | Freq: Once | INTRAMUSCULAR | Status: AC
  Administered 2015-04-19: 50 ug via INTRAVENOUS

## 2015-04-19 MED ORDER — HYDROMORPHONE HCL 1 MG/ML IJ SOLN
0.5000 mg | INTRAMUSCULAR | Status: DC | PRN
  Administered 2015-04-19 (×2): 0.5 mg via INTRAVENOUS

## 2015-04-19 MED ORDER — GLYCOPYRROLATE 0.2 MG/ML IJ SOLN
INTRAMUSCULAR | Status: DC | PRN
  Administered 2015-04-19: .7 mg via INTRAVENOUS
  Administered 2015-04-19: 0.2 mg via INTRAVENOUS

## 2015-04-19 MED ORDER — HYDROMORPHONE HCL 1 MG/ML IJ SOLN
INTRAMUSCULAR | Status: AC
  Administered 2015-04-19: 0.5 mg via INTRAVENOUS
  Filled 2015-04-19: qty 1

## 2015-04-19 MED ORDER — AMLODIPINE BESYLATE 2.5 MG PO TABS
2.5000 mg | ORAL_TABLET | Freq: Every day | ORAL | Status: DC
  Administered 2015-04-20 – 2015-04-22 (×3): 2.5 mg via ORAL
  Filled 2015-04-19 (×3): qty 1

## 2015-04-19 MED ORDER — FENTANYL CITRATE (PF) 100 MCG/2ML IJ SOLN
INTRAMUSCULAR | Status: DC | PRN
  Administered 2015-04-19 (×2): 50 ug via INTRAVENOUS

## 2015-04-19 MED ORDER — TRANEXAMIC ACID 1000 MG/10ML IV SOLN
1000.0000 mg | Freq: Once | INTRAVENOUS | Status: AC
  Administered 2015-04-19: 1000 mg via INTRAVENOUS
  Filled 2015-04-19: qty 10

## 2015-04-19 MED ORDER — ONDANSETRON HCL 4 MG/2ML IJ SOLN
4.0000 mg | Freq: Four times a day (QID) | INTRAMUSCULAR | Status: DC | PRN
  Administered 2015-04-20: 4 mg via INTRAVENOUS
  Filled 2015-04-19 (×2): qty 2

## 2015-04-19 MED ORDER — LOSARTAN POTASSIUM 50 MG PO TABS
100.0000 mg | ORAL_TABLET | Freq: Every day | ORAL | Status: DC
  Administered 2015-04-20 – 2015-04-22 (×3): 100 mg via ORAL
  Filled 2015-04-19 (×4): qty 2

## 2015-04-19 MED ORDER — ONDANSETRON HCL 4 MG PO TABS: 4.0000 mg | ORAL_TABLET | Freq: Four times a day (QID) | ORAL | Status: DC | PRN

## 2015-04-19 MED ORDER — DEXAMETHASONE SODIUM PHOSPHATE 4 MG/ML IJ SOLN
INTRAMUSCULAR | Status: DC | PRN
  Administered 2015-04-19: 4 mg via INTRAVENOUS

## 2015-04-19 MED ORDER — DEXAMETHASONE SODIUM PHOSPHATE 4 MG/ML IJ SOLN
INTRAMUSCULAR | Status: AC
  Filled 2015-04-19: qty 1

## 2015-04-19 MED ORDER — OXYCODONE-ACETAMINOPHEN 5-325 MG PO TABS: 1.0000 | ORAL_TABLET | ORAL | Status: DC | PRN

## 2015-04-19 MED ORDER — DIPHENHYDRAMINE HCL 12.5 MG/5ML PO ELIX: 12.5000 mg | ORAL_SOLUTION | ORAL | Status: DC | PRN

## 2015-04-19 MED ORDER — ACETAMINOPHEN 325 MG PO TABS
650.0000 mg | ORAL_TABLET | Freq: Four times a day (QID) | ORAL | Status: DC | PRN
  Administered 2015-04-21: 650 mg via ORAL
  Filled 2015-04-19: qty 2

## 2015-04-19 MED ORDER — METOPROLOL SUCCINATE ER 50 MG PO TB24
50.0000 mg | ORAL_TABLET | Freq: Every day | ORAL | Status: DC
  Administered 2015-04-20 – 2015-04-22 (×3): 50 mg via ORAL
  Filled 2015-04-19 (×3): qty 1

## 2015-04-19 MED ORDER — METOCLOPRAMIDE HCL 5 MG PO TABS: 5.0000 mg | ORAL_TABLET | Freq: Three times a day (TID) | ORAL | Status: DC | PRN

## 2015-04-19 MED ORDER — ACETAMINOPHEN 10 MG/ML IV SOLN
1000.0000 mg | Freq: Four times a day (QID) | INTRAVENOUS | Status: AC
  Administered 2015-04-19: 1000 mg via INTRAVENOUS

## 2015-04-19 MED ORDER — CHLORHEXIDINE GLUCONATE 4 % EX LIQD: 60.0000 mL | Freq: Once | CUTANEOUS | Status: DC

## 2015-04-19 MED ORDER — HYDROCHLOROTHIAZIDE 25 MG PO TABS
25.0000 mg | ORAL_TABLET | Freq: Every day | ORAL | Status: DC
  Administered 2015-04-20 – 2015-04-22 (×3): 25 mg via ORAL
  Filled 2015-04-19 (×4): qty 1

## 2015-04-19 MED ORDER — FLEET ENEMA 7-19 GM/118ML RE ENEM: 1.0000 | ENEMA | Freq: Once | RECTAL | Status: DC | PRN

## 2015-04-19 MED ORDER — SODIUM CHLORIDE 0.9 % IV SOLN
INTRAVENOUS | Status: DC
  Administered 2015-04-20: 04:00:00 via INTRAVENOUS

## 2015-04-19 MED ORDER — MIDAZOLAM HCL 2 MG/2ML IJ SOLN: 2.0000 mg | Freq: Once | INTRAMUSCULAR | Status: DC

## 2015-04-19 MED ORDER — BISACODYL 10 MG RE SUPP: 10.0000 mg | Freq: Every day | RECTAL | Status: DC | PRN

## 2015-04-19 MED ORDER — ROCURONIUM BROMIDE 100 MG/10ML IV SOLN
INTRAVENOUS | Status: DC | PRN
  Administered 2015-04-19: 50 mg via INTRAVENOUS

## 2015-04-19 MED ORDER — MENTHOL 3 MG MT LOZG: 1.0000 | LOZENGE | OROMUCOSAL | Status: DC | PRN

## 2015-04-19 MED ORDER — PHENOL 1.4 % MT LIQD: 1.0000 | OROMUCOSAL | Status: DC | PRN

## 2015-04-19 MED ORDER — ACETAMINOPHEN 650 MG RE SUPP: 650.0000 mg | Freq: Four times a day (QID) | RECTAL | Status: DC | PRN

## 2015-04-19 MED ORDER — DOCUSATE SODIUM 100 MG PO CAPS
100.0000 mg | ORAL_CAPSULE | Freq: Two times a day (BID) | ORAL | Status: DC
  Administered 2015-04-19 – 2015-04-22 (×6): 100 mg via ORAL
  Filled 2015-04-19 (×6): qty 1

## 2015-04-19 MED ORDER — SODIUM CHLORIDE 0.9 % IR SOLN
Status: DC | PRN
  Administered 2015-04-19: 1000 mL

## 2015-04-19 MED ORDER — METOCLOPRAMIDE HCL 5 MG/ML IJ SOLN: 5.0000 mg | Freq: Three times a day (TID) | INTRAMUSCULAR | Status: DC | PRN

## 2015-04-19 MED ORDER — KETOROLAC TROMETHAMINE 30 MG/ML IJ SOLN
INTRAMUSCULAR | Status: AC
  Filled 2015-04-19: qty 1

## 2015-04-19 MED ORDER — HYDROMORPHONE HCL 1 MG/ML IJ SOLN
0.2500 mg | INTRAMUSCULAR | Status: DC | PRN
  Administered 2015-04-19 (×4): 0.5 mg via INTRAVENOUS

## 2015-04-19 MED ORDER — STERILE WATER FOR INJECTION IJ SOLN
INTRAMUSCULAR | Status: AC
  Filled 2015-04-19: qty 10

## 2015-04-19 MED ORDER — HYDROMORPHONE HCL 1 MG/ML IJ SOLN
1.0000 mg | INTRAMUSCULAR | Status: DC | PRN
  Administered 2015-04-20 – 2015-04-21 (×6): 1 mg via INTRAVENOUS
  Filled 2015-04-19 (×6): qty 1

## 2015-04-19 MED ORDER — HYDRALAZINE HCL 20 MG/ML IJ SOLN
5.0000 mg | Freq: Once | INTRAMUSCULAR | Status: AC
Start: 2015-04-19 — End: 2015-04-19
  Administered 2015-04-19: 5 mg via INTRAVENOUS
  Filled 2015-04-19: qty 1

## 2015-04-19 MED ORDER — POLYETHYLENE GLYCOL 3350 17 G PO PACK: 17.0000 g | PACK | Freq: Every day | ORAL | Status: DC | PRN

## 2015-04-19 MED ORDER — FENTANYL CITRATE (PF) 250 MCG/5ML IJ SOLN
INTRAMUSCULAR | Status: AC
  Filled 2015-04-19: qty 5

## 2015-04-19 SURGICAL SUPPLY — 64 items
BANDAGE ELASTIC 4 VELCRO ST LF (GAUZE/BANDAGES/DRESSINGS) ×3 IMPLANT
BANDAGE ELASTIC 6 VELCRO ST LF (GAUZE/BANDAGES/DRESSINGS) ×3 IMPLANT
BANDAGE ESMARK 6X9 LF (GAUZE/BANDAGES/DRESSINGS) ×1 IMPLANT
BLADE SAGITTAL 25.0X1.19X90 (BLADE) ×2 IMPLANT
BLADE SAGITTAL 25.0X1.19X90MM (BLADE) ×1
BLADE SAW SAG 90X13X1.27 (BLADE) ×3 IMPLANT
BNDG CMPR 9X6 STRL LF SNTH (GAUZE/BANDAGES/DRESSINGS) ×1
BNDG ESMARK 6X9 LF (GAUZE/BANDAGES/DRESSINGS) ×3
BOWL SMART MIX CTS (DISPOSABLE) ×3 IMPLANT
CAP KNEE TOTAL 3 SIGMA ×3 IMPLANT
CEMENT HV SMART SET (Cement) ×6 IMPLANT
COVER SURGICAL LIGHT HANDLE (MISCELLANEOUS) ×3 IMPLANT
CUFF TOURNIQUET SINGLE 34IN LL (TOURNIQUET CUFF) ×3 IMPLANT
CUFF TOURNIQUET SINGLE 44IN (TOURNIQUET CUFF) IMPLANT
DRAPE INCISE IOBAN 66X45 STRL (DRAPES) IMPLANT
DRAPE ORTHO SPLIT 77X108 STRL (DRAPES) ×6
DRAPE SURG ORHT 6 SPLT 77X108 (DRAPES) ×2 IMPLANT
DRAPE U-SHAPE 47X51 STRL (DRAPES) ×3 IMPLANT
DRSG ADAPTIC 3X8 NADH LF (GAUZE/BANDAGES/DRESSINGS) ×3 IMPLANT
DRSG PAD ABDOMINAL 8X10 ST (GAUZE/BANDAGES/DRESSINGS) ×6 IMPLANT
DURAPREP 26ML APPLICATOR (WOUND CARE) ×3 IMPLANT
ELECT REM PT RETURN 9FT ADLT (ELECTROSURGICAL) ×3
ELECTRODE REM PT RTRN 9FT ADLT (ELECTROSURGICAL) ×1 IMPLANT
EVACUATOR 1/8 PVC DRAIN (DRAIN) IMPLANT
FACESHIELD WRAPAROUND (MASK) ×6 IMPLANT
FLOSEAL 10ML (HEMOSTASIS) IMPLANT
GAUZE SPONGE 4X4 12PLY STRL (GAUZE/BANDAGES/DRESSINGS) ×3 IMPLANT
GLOVE BIOGEL PI IND STRL 8 (GLOVE) ×4 IMPLANT
GLOVE BIOGEL PI INDICATOR 8 (GLOVE) ×8
GLOVE ORTHO TXT STRL SZ7.5 (GLOVE) ×3 IMPLANT
GLOVE SURG ORTHO 8.0 STRL STRW (GLOVE) ×3 IMPLANT
GOWN STRL REUS W/ TWL LRG LVL3 (GOWN DISPOSABLE) ×2 IMPLANT
GOWN STRL REUS W/ TWL XL LVL3 (GOWN DISPOSABLE) ×1 IMPLANT
GOWN STRL REUS W/TWL 2XL LVL3 (GOWN DISPOSABLE) ×3 IMPLANT
GOWN STRL REUS W/TWL LRG LVL3 (GOWN DISPOSABLE) ×6
GOWN STRL REUS W/TWL XL LVL3 (GOWN DISPOSABLE) ×3
HANDPIECE INTERPULSE COAX TIP (DISPOSABLE) ×3
HOOD PEEL AWAY FACE SHEILD DIS (HOOD) ×3 IMPLANT
IMMOBILIZER KNEE 22 UNIV (SOFTGOODS) IMPLANT
KIT BASIN OR (CUSTOM PROCEDURE TRAY) ×3 IMPLANT
KIT ROOM TURNOVER OR (KITS) ×3 IMPLANT
MANIFOLD NEPTUNE II (INSTRUMENTS) ×3 IMPLANT
NEEDLE 22X1 1/2 (OR ONLY) (NEEDLE) IMPLANT
NS IRRIG 1000ML POUR BTL (IV SOLUTION) ×3 IMPLANT
PACK TOTAL JOINT (CUSTOM PROCEDURE TRAY) ×3 IMPLANT
PACK UNIVERSAL I (CUSTOM PROCEDURE TRAY) ×3 IMPLANT
PAD ARMBOARD 7.5X6 YLW CONV (MISCELLANEOUS) ×6 IMPLANT
PAD CAST 4YDX4 CTTN HI CHSV (CAST SUPPLIES) ×1 IMPLANT
PADDING CAST COTTON 4X4 STRL (CAST SUPPLIES) ×2
PADDING CAST COTTON 6X4 STRL (CAST SUPPLIES) ×3 IMPLANT
SET HNDPC FAN SPRY TIP SCT (DISPOSABLE) ×1 IMPLANT
STAPLER VISISTAT 35W (STAPLE) ×3 IMPLANT
SUCTION FRAZIER HANDLE 10FR (MISCELLANEOUS) ×2
SUCTION TUBE FRAZIER 10FR DISP (MISCELLANEOUS) ×1 IMPLANT
SUT ETHIBOND NAB CT1 #1 30IN (SUTURE) ×9 IMPLANT
SUT VIC AB 0 CT1 27 (SUTURE) ×3
SUT VIC AB 0 CT1 27XBRD ANBCTR (SUTURE) ×1 IMPLANT
SUT VIC AB 2-0 CT1 27 (SUTURE) ×6
SUT VIC AB 2-0 CT1 TAPERPNT 27 (SUTURE) ×2 IMPLANT
SYR CONTROL 10ML LL (SYRINGE) IMPLANT
TOWEL OR 17X24 6PK STRL BLUE (TOWEL DISPOSABLE) ×3 IMPLANT
TOWEL OR 17X26 10 PK STRL BLUE (TOWEL DISPOSABLE) ×3 IMPLANT
TRAY FOLEY CATH 16FRSI W/METER (SET/KITS/TRAYS/PACK) IMPLANT
WATER STERILE IRR 1000ML POUR (IV SOLUTION) ×3 IMPLANT

## 2015-04-19 NOTE — Evaluation (Signed)
Physical Therapy Evaluation Patient Details Name: Rhonda Hart MRN: [id number] DOB: 07/19/1945 Today's Date: 04/19/2015   History of Present Illness  Patient is a 70 yo female admitted 04/19/15 now s/p Rt TKA.    PMH:  HTN, HLD, Bil THA with revision Rt, arthritis  Clinical Impression  Patient presents with problems listed below.  Will benefit from acute PT to maximize functional mobility prior to discharge home with family.  Patient with nausea and lethargy today limiting session.  Able to ambulate 15' with min assist and RW.  Recommend HHPT at discharge for continued therapy.    Follow Up Recommendations Home health PT;Supervision/Assistance - 24 hour    Equipment Recommendations  None recommended by PT    Recommendations for Other Services       Precautions / Restrictions Precautions Precautions: Knee Precaution Booklet Issued: Yes (comment) Precaution Comments: Reviewed precautions Required Braces or Orthoses: Knee Immobilizer - Right Knee Immobilizer - Right: On when out of bed or walking (d/c when block wears off) Restrictions Weight Bearing Restrictions: Yes RLE Weight Bearing: Weight bearing as tolerated      Mobility  Bed Mobility Overal bed mobility: Needs Assistance Bed Mobility: Sit to Supine       Sit to supine: Mod assist;+2 for physical assistance   General bed mobility comments: Verbal cues for technique.  Assist to lower trunk and to bring LLE onto bed.    Transfers Overall transfer level: Needs assistance Equipment used: Rolling walker (2 wheeled) Transfers: Sit to/from Stand Sit to Stand: Min assist         General transfer comment: Verbal cues for hand placement.  Assist to rise to standing and to steady  Ambulation/Gait Ambulation/Gait assistance: Min assist Ambulation Distance (Feet): 15 Feet Assistive device: Rolling walker (2 wheeled) Gait Pattern/deviations: Step-to pattern;Decreased stance time - right;Decreased step length -  left;Decreased stride length;Decreased weight shift to right;Antalgic Gait velocity: decreased Gait velocity interpretation: Below normal speed for age/gender General Gait Details: Verbal cues and physical assist to maneuver RW.  Cues for gait sequence and to stand upright.    Stairs            Wheelchair Mobility    Modified Rankin (Stroke Patients Only)       Balance Overall balance assessment: Needs assistance         Standing balance support: Bilateral upper extremity supported Standing balance-Leahy Scale: Poor                               Pertinent Vitals/Pain Pain Assessment: Faces Faces Pain Scale: Hurts little more Pain Location: Rt knee Pain Descriptors / Indicators: Grimacing;Sore Pain Intervention(s): Limited activity within patient's tolerance;Monitored during session;Repositioned    Home Living Family/patient expects to be discharged to:: Private residence Living Arrangements: Spouse/significant other;Children (Husband and son) Available Help at Discharge: Family;Available 24 hours/day Type of Home: House Home Access: Stairs to enter Entrance Stairs-Rails: None Entrance Stairs-Number of Steps: 2 Home Layout: One level Home Equipment: Walker - 2 wheels;Cane - single point;Bedside commode Additional Comments: Husband having health issues    Prior Function Level of Independence: Independent               Hand Dominance   Dominant Hand: Right    Extremity/Trunk Assessment   Upper Extremity Assessment: Overall WFL for tasks assessed           Lower Extremity Assessment: RLE deficits/detail RLE Deficits / Details:  Decreased strength and ROM post-op       Communication   Communication: No difficulties  Cognition Arousal/Alertness: Lethargic Behavior During Therapy: Flat affect Overall Cognitive Status: Within Functional Limits for tasks assessed                      General Comments      Exercises Total  Joint Exercises Ankle Circles/Pumps: AROM;Both;10 reps;Supine      Assessment/Plan    PT Assessment Patient needs continued PT services  PT Diagnosis Difficulty walking;Acute pain   PT Problem List Decreased strength;Decreased range of motion;Decreased activity tolerance;Decreased balance;Decreased mobility;Decreased knowledge of use of DME;Pain  PT Treatment Interventions DME instruction;Gait training;Stair training;Functional mobility training;Therapeutic activities;Therapeutic exercise;Patient/family education   PT Goals (Current goals can be found in the Care Plan section) Acute Rehab PT Goals Patient Stated Goal: None stated PT Goal Formulation: With patient/family Time For Goal Achievement: 04/26/15 Potential to Achieve Goals: Good    Frequency 7X/week   Barriers to discharge        Co-evaluation               End of Session Equipment Utilized During Treatment: Gait belt;Right knee immobilizer;Oxygen Activity Tolerance: Patient limited by lethargy;Patient limited by pain (Limited by nausea) Patient left: in bed;with call bell/phone within reach;with family/visitor present Nurse Communication: Mobility status         Time: [id number] PT Time Calculation (min) (ACUTE ONLY): 17 min   Charges:   PT Evaluation $PT Eval Moderate Complexity: 1 Procedure     PT G CodesDespina Pole 29-Apr-2015, 6:50 PM Carita Pian. Sanjuana Kava, [place] Pager [phone]

## 2015-04-19 NOTE — Anesthesia Preprocedure Evaluation (Addendum)
Anesthesia Evaluation  Patient identified by MRN, date of birth, ID band Patient awake    Reviewed: Allergy & Precautions, H&P , NPO status , Patient's Chart, lab work & pertinent test results, reviewed documented beta blocker date and time   Airway Mallampati: I  TM Distance: >3 FB Neck ROM: Full    Dental no notable dental hx. (+) Edentulous Upper, Edentulous Lower, Dental Advisory Given   Pulmonary neg pulmonary ROS,    Pulmonary exam normal breath sounds clear to auscultation       Cardiovascular hypertension, Pt. on medications and Pt. on home beta blockers  Rhythm:Regular Rate:Normal     Neuro/Psych negative neurological ROS  negative psych ROS   GI/Hepatic negative GI ROS, Neg liver ROS,   Endo/Other  negative endocrine ROS  Renal/GU negative Renal ROS  negative genitourinary   Musculoskeletal  (+) Arthritis , Osteoarthritis,    Abdominal   Peds  Hematology negative hematology ROS (+)   Anesthesia Other Findings   Reproductive/Obstetrics negative OB ROS                            Anesthesia Physical Anesthesia Plan  ASA: II  Anesthesia Plan: General and Regional   Post-op Pain Management: GA combined w/ Regional for post-op pain   Induction: Intravenous  Airway Management Planned: Oral ETT  Additional Equipment:   Intra-op Plan:   Post-operative Plan: Extubation in OR  Informed Consent: I have reviewed the patients History and Physical, chart, labs and discussed the procedure including the risks, benefits and alternatives for the proposed anesthesia with the patient or authorized representative who has indicated his/her understanding and acceptance.   Dental advisory given  Plan Discussed with: CRNA  Anesthesia Plan Comments:        Anesthesia Quick Evaluation

## 2015-04-19 NOTE — Progress Notes (Signed)
Utilization review completed.  

## 2015-04-19 NOTE — H&P (View-Only) (Signed)
TOTAL KNEE ADMISSION H&P  Patient is being admitted for right total knee arthroplasty.  Subjective:  Chief Complaint:right knee pain.  HPI: Rhonda Hart, 70 y.o. female, has a history of pain and functional disability in the right knee due to arthritis and has failed non-surgical conservative treatments for greater than 12 weeks to includeNSAID's and/or analgesics, corticosteriod injections, use of assistive devices and activity modification.  Onset of symptoms was gradual, starting 3 years ago with rapidlly worsening course since that time. The patient noted no past surgery on the right knee(s).  Patient currently rates pain in the right knee(s) at 10 out of 10 with activity. Patient has night pain, worsening of pain with activity and weight bearing, pain that interferes with activities of daily living, pain with passive range of motion, crepitus and joint swelling.  Patient has evidence of periarticular osteophytes and joint space narrowing by imaging studies.There is no active infection.  Patient Active Problem List   Diagnosis Date Noted  . Hypertension   . Hyperlipidemia   . 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 Hypertension     takes Hyzaar,HCTZ,and Metoprolol daily  . Hyperlipidemia     has never been on meds  . Arthritis   . Joint pain   . History of colon polyps     benign    Past Surgical History  Procedure Laterality Date  . Abdominal hysterectomy    . Total hip arthroplasty      x 2 on right and x 1 on left  . Cesarean section    . Colonosocpy    . Total hip revision Left 11/30/2014    Procedure: TOTAL LEFT HIP REVISION;  Surgeon: Earlie Server, MD;  Location: [facility];  Service: Orthopedics;  Laterality: Left;     (Not in a hospital admission) Allergies  Allergen Reactions  . Coumadin [Warfarin Sodium] Rash    Social History  Substance Use Topics  .  Smoking status: Never Smoker   . Smokeless tobacco: Not on file  . Alcohol Use: No    No family history on file.   Review of Systems  Musculoskeletal: Positive for joint pain.  All other systems reviewed and are negative.   Objective:  Physical Exam  Constitutional: She is oriented to person, place, and time. She appears well-developed and well-nourished. No distress.  HENT:  Head: Normocephalic and atraumatic.  Nose: Nose normal.  Eyes: Conjunctivae and EOM are normal. Pupils are equal, round, and reactive to light.  Neck: Normal range of motion. Neck supple.  Cardiovascular: Normal rate, regular rhythm, normal heart sounds and intact distal pulses.   Respiratory: Effort normal and breath sounds normal. No respiratory distress. She has no wheezes.  GI: Soft. Bowel sounds are normal. She exhibits no distension. There is no tenderness.  Musculoskeletal:       Right knee: She exhibits swelling. She exhibits no erythema, no LCL laxity and no MCL laxity. Tenderness found.  Lymphadenopathy:    She has no cervical adenopathy.  Neurological: She is alert and oriented to person, place, and time. No cranial nerve deficit.  Skin: Skin is warm and dry. No rash noted. No erythema.  Psychiatric: She has a normal mood and affect. Her behavior is normal.    Vital signs in last 24 hours: @VSRANGES @  Labs:   Estimated body mass index is 29.51 kg/(m^2) as calculated from the  following:   Height as of 11/20/14: 5\' 1"  (1.549 m).   Weight as of 11/20/14: 70.806 kg (156 lb 1.6 oz).   Imaging Review Plain radiographs demonstrate severe degenerative joint disease of the right knee(s). The overall alignment issignificant valgus. The bone quality appears to be good for age and reported activity level.  Assessment/Plan:  End stage arthritis, right knee   The patient history, physical examination, clinical judgment of the provider and imaging studies are consistent with end stage degenerative  joint disease of the right knee(s) and total knee arthroplasty is deemed medically necessary. The treatment options including medical management, injection therapy arthroscopy and arthroplasty were discussed at length. The risks and benefits of total knee arthroplasty were presented and reviewed. The risks due to aseptic loosening, infection, stiffness, patella tracking problems, thromboembolic complications and other imponderables were discussed. The patient acknowledged the explanation, agreed to proceed with the plan and consent was signed. Patient is being admitted for inpatient treatment for surgery, pain control, PT, OT, prophylactic antibiotics, VTE prophylaxis, progressive ambulation and ADL's and discharge planning. The patient is planning to be discharged home with home health services

## 2015-04-19 NOTE — Interval H&P Note (Signed)
History and Physical Interval Note:  04/19/2015 7:25 AM  Rhonda Hart  has presented today for surgery, with the diagnosis of OA RIGHT KNEE  The various methods of treatment have been discussed with the patient and family. After consideration of risks, benefits and other options for treatment, the patient has consented to  Procedure(s): RIGHT TOTAL KNEE ARTHROPLASTY (Right) as a surgical intervention .  The patient's history has been reviewed, patient examined, no change in status, stable for surgery.  I have reviewed the patient's chart and labs.  Questions were answered to the patient's satisfaction.     Stokes Rattigan JR,W D

## 2015-04-19 NOTE — Anesthesia Procedure Notes (Addendum)
Anesthesia Regional Block:  Femoral nerve block  Pre-Anesthetic Checklist: ,, timeout performed, Correct Patient, Correct Site, Correct Laterality, Correct Procedure, Correct Position, site marked, Risks and benefits discussed, pre-op evaluation,  At surgeon's request and post-op pain management  Laterality: Right  Prep: Maximum Sterile Barrier Precautions used and chloraprep       Needles:  Injection technique: Single-shot  Needle Type: Echogenic Stimulator Needle     Needle Length: 5cm 5 cm Needle Gauge: 22 and 22 G    Additional Needles:  Procedures: ultrasound guided (picture in chart) Femoral nerve block  Nerve Stimulator or Paresthesia:  Response: Patellar respose,   Additional Responses:   Narrative:  Start time: 04/19/2015 9:46 AM End time: 04/19/2015 9:56 AM Injection made incrementally with aspirations every 5 mL. Anesthesiologist: Roderic Palau  Additional Notes: 2% Lidocaine skin wheel.    Procedure Name: Intubation Date/Time: 04/19/2015 10:30 AM Performed by: Adalberto Ill Pre-anesthesia Checklist: Patient identified, Emergency Drugs available, Suction available, Patient being monitored and Timeout performed Patient Re-evaluated:Patient Re-evaluated prior to inductionOxygen Delivery Method: Circle system utilized Preoxygenation: Pre-oxygenation with 100% oxygen Intubation Type: IV induction Ventilation: Mask ventilation without difficulty Laryngoscope Size: Miller and 3 Grade View: Grade I Tube type: Oral Tube size: 7.0 mm Number of attempts: 1 Placement Confirmation: ETT inserted through vocal cords under direct vision,  positive ETCO2,  CO2 detector and breath sounds checked- equal and bilateral Secured at: 20 cm Tube secured with: Tape Dental Injury: Teeth and Oropharynx as per pre-operative assessment

## 2015-04-19 NOTE — Transfer of Care (Signed)
Immediate Anesthesia Transfer of Care Note  Patient: Rhonda Hart  Procedure(s) Performed: Procedure(s): RIGHT TOTAL KNEE ARTHROPLASTY (Right)  Patient Location: PACU  Anesthesia Type:GA combined with regional for post-op pain  Level of Consciousness: awake, alert , oriented and patient cooperative  Airway & Oxygen Therapy: Patient Spontanous Breathing and Patient connected to nasal cannula oxygen  Post-op Assessment: Report given to RN and Post -op Vital signs reviewed and stable  Post vital signs: Reviewed and stable  Last Vitals:  Filed Vitals:   04/19/15 0938 04/19/15 1230  BP:    Pulse: 53 63  Temp:  36.6 C  Resp: 11     Complications: No apparent anesthesia complications

## 2015-04-19 NOTE — Anesthesia Postprocedure Evaluation (Signed)
Anesthesia Post Note  Patient: Rhonda Hart  Procedure(s) Performed: Procedure(s) (LRB): RIGHT TOTAL KNEE ARTHROPLASTY (Right)  Patient location during evaluation: PACU Anesthesia Type: General and Regional Level of consciousness: awake and alert Pain management: pain level controlled Vital Signs Assessment: post-procedure vital signs reviewed and stable Respiratory status: spontaneous breathing, nonlabored ventilation, respiratory function stable and patient connected to nasal cannula oxygen Cardiovascular status: blood pressure returned to baseline and stable Postop Assessment: no signs of nausea or vomiting Anesthetic complications: no    Last Vitals:  Filed Vitals:   04/19/15 1415 04/19/15 1434  BP:  143/64  Pulse: 56 57  Temp:  36.6 C  Resp: 15 16    Last Pain:  Filed Vitals:   04/19/15 1436  PainSc: 3                  Asuzena Weis,W. EDMOND

## 2015-04-19 NOTE — Brief Op Note (Signed)
04/19/2015  12:30 PM  PATIENT:  [name]  70 y.o. female  PRE-OPERATIVE DIAGNOSIS:  OA RIGHT KNEE  POST-OPERATIVE DIAGNOSIS:  OA RIGHT KNEE  PROCEDURE:  Procedure(s): RIGHT TOTAL KNEE ARTHROPLASTY (Right)  SURGEON:  Surgeon(s) and Role:    * Earlie Server, MD - Primary  PHYSICIAN ASSISTANT: Chriss Czar, PA-C  ASSISTANTS:  ANESTHESIA:   regional and general  EBL:  Total I/O In: 1000 [I.V.:1000] Out: -   BLOOD ADMINISTERED:none  DRAINS: none   LOCAL MEDICATIONS USED:  NONE  SPECIMEN:  No Specimen  DISPOSITION OF SPECIMEN:  N/A  COUNTS:  YES  TOURNIQUET:   Total Tourniquet Time Documented: Thigh (Right) - 62 minutes Total: Thigh (Right) - 62 minutes   DICTATION: .Other Dictation: Dictation Number unknown  PLAN OF CARE: Admit to inpatient   PATIENT DISPOSITION:  PACU - hemodynamically stable.   Delay start of Pharmacological VTE agent (>24hrs) due to surgical blood loss or risk of bleeding: yes

## 2015-04-19 NOTE — Progress Notes (Signed)
Orthopedic Tech Progress Note Patient Details:  Rhonda Hart 06/28/1945 [id number]  CPM Right Knee CPM Right Knee: On Right Knee Flexion (Degrees): 90 Right Knee Extension (Degrees): 0 Additional Comments: trapeze bar patient helper Viewed order from doctor's order list  Hildred Priest 04/19/2015, 1:35 PM

## 2015-04-20 LAB — BASIC METABOLIC PANEL
ANION GAP: 12 (ref 5–15)
BUN: 15 mg/dL (ref 6–20)
CO2: 21 mmol/L — AB (ref 22–32)
Calcium: 8.6 mg/dL — ABNORMAL LOW (ref 8.9–10.3)
Chloride: 105 mmol/L (ref 101–111)
Creatinine, Ser: 0.74 mg/dL (ref 0.44–1.00)
GLUCOSE: 102 mg/dL — AB (ref 65–99)
POTASSIUM: 3.6 mmol/L (ref 3.5–5.1)
Sodium: 138 mmol/L (ref 135–145)

## 2015-04-20 LAB — CBC
HEMATOCRIT: 33.2 % — AB (ref 36.0–46.0)
HEMOGLOBIN: 10.8 g/dL — AB (ref 12.0–15.0)
MCH: 29.3 pg (ref 26.0–34.0)
MCHC: 32.5 g/dL (ref 30.0–36.0)
MCV: 90 fL (ref 78.0–100.0)
Platelets: 233 10*3/uL (ref 150–400)
RBC: 3.69 MIL/uL — AB (ref 3.87–5.11)
RDW: 14.1 % (ref 11.5–15.5)
WBC: 8.3 10*3/uL (ref 4.0–10.5)

## 2015-04-20 MED ORDER — BETHANECHOL CHLORIDE 10 MG PO TABS
10.0000 mg | ORAL_TABLET | Freq: Three times a day (TID) | ORAL | Status: DC
Start: 2015-04-20 — End: 2015-04-22
  Administered 2015-04-20 – 2015-04-22 (×6): 10 mg via ORAL
  Filled 2015-04-20 (×8): qty 1

## 2015-04-20 MED ORDER — HYDRALAZINE HCL 20 MG/ML IJ SOLN
10.0000 mg | Freq: Once | INTRAMUSCULAR | Status: AC
  Administered 2015-04-20: 10 mg via INTRAVENOUS
  Filled 2015-04-20: qty 1

## 2015-04-20 NOTE — Care Management Note (Signed)
Case Management Note  Patient Details  Name: Rhonda Hart MRN: [id number] Date of Birth: 01/03/1946  Subjective/Objective: 70 yo F s/p Rt TKA               Action/Plan: received referral for [facility] PT and DME   Expected Discharge Date: 04/21/15                 Expected Discharge Plan:  [place]  In-House Referral:     Discharge planning Services  CM Consult  Post Acute Care Choice:    Choice offered to:     DME Arranged:    DME Agency:     HH Arranged:  PT [place]:  [place]  Status of Service:  Completed, signed off  Medicare Important Message Given:    Date Medicare IM Given:    Medicare IM give by:    Date Additional Medicare IM Given:    Additional Medicare Important Message give by:     If discussed at [place] of Stay Meetings, dates discussed:    Additional Comments: met with pt and son at bedside. PT is recommending 24 hr supervision. Discharge plan is to return home with the support of her husband and son with 24 supervision. They are aware that she requires 24 hr supervision. Pt has a RW, cane, and a toilet seat.  HHPT has been arranged with [facility] prior to surgery and pt is agreeable with agency. Contacted Tiffany at [facility] to discuss referral.  Norina Buzzard, RN 04/20/2015, 9:20 AM

## 2015-04-20 NOTE — Progress Notes (Signed)
Orthopaedic Trauma Service Progress Note Weekend coverage   Subjective  Doing ok Just had I&O cath with approx 800cc output, this is second IO today. First one yielded 500cc Issues with HTN post op, seem to be better this am No other specific complaints  Review of Systems  Constitutional: Negative for fever and chills.  Respiratory: Negative for shortness of breath.   Cardiovascular: Negative for chest pain and palpitations.  Gastrointestinal: Negative for nausea, vomiting and abdominal pain.  Genitourinary:       Retention   Neurological: Negative for tingling, sensory change and headaches.     Objective   BP 143/59 mmHg  Pulse 74  Temp(Src) 98.9 F (37.2 C) (Oral)  Resp 16  Ht '5\' 1"'$  (1.549 m)  Wt 75.694 kg (166 lb 14 oz)  BMI 31.55 kg/m2  SpO2 99%  Intake/Output      04/07 0701 - 04/08 0700 04/08 0701 - 04/09 0700   P.O. 120 240   I.V. (mL/kg) 1200 (15.9)    Total Intake(mL/kg) 1320 (17.4) 240 (3.2)   Urine (mL/kg/hr) 850 800 (2.1)   Blood 50    Total Output 900 800   Net +420 -560        Urine Occurrence 2 x      Labs  Results for Rhonda, Hart (MRN [id number]) as of 04/20/2015 12:10  Ref. Range 04/20/2015 04:36  Sodium Latest Ref Range: 135-145 mmol/L 138  Potassium Latest Ref Range: 3.5-5.1 mmol/L 3.6  Chloride Latest Ref Range: 101-111 mmol/L 105  CO2 Latest Ref Range: 22-32 mmol/L 21 (L)  BUN Latest Ref Range: 6-20 mg/dL 15  Creatinine Latest Ref Range: 0.44-1.00 mg/dL 0.74  Calcium Latest Ref Range: 8.9-10.3 mg/dL 8.6 (L)  EGFR (Non-African Amer.) Latest Ref Range: >60 mL/min >60  EGFR (African American) Latest Ref Range: >60 mL/min >60  Glucose Latest Ref Range: 65-99 mg/dL 102 (H)  Anion gap Latest Ref Range: 5-15  12  WBC Latest Ref Range: 4.0-10.5 K/uL 8.3  RBC Latest Ref Range: 3.87-5.11 MIL/uL 3.69 (L)  Hemoglobin Latest Ref Range: 12.0-15.0 g/dL 10.8 (L)  HCT Latest Ref Range: 36.0-46.0 % 33.2 (L)  MCV Latest Ref Range: 78.0-100.0 fL  90.0  MCH Latest Ref Range: 26.0-34.0 pg 29.3  MCHC Latest Ref Range: 30.0-36.0 g/dL 32.5  RDW Latest Ref Range: 11.5-15.5 % 14.1  Platelets Latest Ref Range: 150-400 K/uL 233    Exam  Gen: in bed, NAD  Lungs: clear anterior fields Cardiac: RRR, S1 and S2 Abd:+ BS, NTND  Ext:       Right Lower Extremity   Dressing c/d/i  Ext warm  Motor and sensory functions intact  EHL, FHL, AT, PT, peroneals, gastroc motor intact  DPN, SPN, TN sensation intact  No DCT   Compartments soft     Assessment and Plan   POD/HD#: 1  70 y/o black female with 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   -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:  WBAT  PT/OT evals  Ice and elevate  Bone foam to help knee get to full extension   CPM    - Pain management:  Continue with current regiment  - ABL anemia/Hemodynamics  Cbc in am   BP improved, continue to monitor   - Medical issues   Urinary retention    Will start urecholine 10 mg po TID  If IO needs to be performed again will have foley left    Chronic medical issues   Continue home  meds  - DVT/PE prophylaxis:  apixaban - ID:   periop abx    - Activity:  WBAT  PT/OT   - FEN/GI prophylaxis/Foley/Lines:  Diet as tolerated    - Dispo:  Therapy evals     [name], PA-C [facility] [phone] (O) 04/20/2015 12:06 PM

## 2015-04-20 NOTE — Op Note (Signed)
NAMEMarland Kitchen  Rhonda Hart, Rhonda Hart NO.:  [id number]  MEDICAL RECORD NO.:  [id number]  LOCATION:  [id number]                        FACILITY:  [facility]  PHYSICIAN:  Lockie Pares, M.D.    DATE OF BIRTH:  09-28-1945  DATE OF PROCEDURE:  04/19/2015 DATE OF DISCHARGE:                              OPERATIVE REPORT   PREOPERATIVE DIAGNOSIS:  Severe osteoarthritis, right knee with valgus deformity.  POSTOPERATIVE DIAGNOSIS:  Severe osteoarthritis, right knee with valgus deformity.  OPERATION:  Right total knee replacement(Sigma size 3 femur, 2.5 tibia, 10 mm band, 35 mm all poly patella.  SURGEON:  Lockie Pares, M.D.  ASSISTANTMarjo Bicker, PA.  TOURNIQUET TIME:  60 minutes.  ANESTHESIA:  General with nerve block.  DESCRIPTION OF PROCEDURE:  Supine positioning, sanguinous leg inflation to 350, straight skin incision was made, medial parapatellar approach to the knee made, most pronounced where somewhat isolated to a severe valgus wear pattern, particularly on the tibial side, but also on the femur eroding the posterior lateral aspect of the tibia, particularly we cut 11 mm with a 5 degree valgus cut on the femur followed by cutting about 2-3 mm below the most diseased lateral compartment extension gap measured at 10 mm.  Attention next directed the femur sized to be a size 3.  We placed the provisional pins followed by the final pin in all 1 cutting block for the anterior-posterior chamfer cuts.  We released the PCL resected some excess bone particularly from the lateral side of the knee.  Significant tightness was encountered on the lateral side, but two combination of dissection as well as resection of bone, we did not have to do any major releases on the lateral side.  Keel hole was cut from the tibial base plate, followed by trial tibia.  Then we cut the box cut for the femur and placed the trial femur.  Trial femur with tibia with 10 mm bearing. We obtained full extension,  improved the alignment to about a 5 degree valgus alignment.  There was noticed instability of the bearing in flexion.  The patella was cut leaving about 17 mm of native patella 35 mm all poly trial.  All trials were placed.  Again full extension, good balance in ligament, bearing tracked normally.  The trial components removed.  The bony surfaces were irrigated.  We then inserted the bone cement with the cement in a doughy state, tibia followed by femur patella.  We elected to use a trial bearing with the cement hardened.  Cement was allowed to harden.  Trial bearing was removed.  No excess cement was noted posterior aspect of the knee.  The tourniquet was released and no excessive bleeding was noted.  Small bleeders were coagulated.  Final bearing was placed.  Again all parameters deemed to be acceptable.  Closure was affected with #1 Ethibond 2-0 Vicryl and skin clips was taken to recovery in stable condition.     Lockie Pares, M.D.     WDC/MEDQ  D:  04/19/2015  T:  04/20/2015  Job:  [id number]

## 2015-04-20 NOTE — Discharge Instructions (Signed)
INSTRUCTIONS AFTER JOINT REPLACEMENT  ° °o Remove items at home which could result in a fall. This includes throw rugs or furniture in walking pathways °o ICE to the affected joint every three hours while awake for 30 minutes at a time, for at least the first 3-5 days, and then as needed for pain and swelling.  Continue to use ice for pain and swelling. You may notice swelling that will progress down to the foot and ankle.  This is normal after surgery.  Elevate your leg when you are not up walking on it.   °o Continue to use the breathing machine you got in the hospital (incentive spirometer) which will help keep your temperature down.  It is common for your temperature to cycle up and down following surgery, especially at night when you are not up moving around and exerting yourself.  The breathing machine keeps your lungs expanded and your temperature down. ° ° °DIET:  As you were doing prior to hospitalization, we recommend a well-balanced diet. ° °DRESSING / WOUND CARE / SHOWERING ° °You may change your dressing 3-5 days after surgery.  Then change the dressing every day with sterile gauze.  Please use good hand washing techniques before changing the dressing.  Do not use any lotions or creams on the incision until instructed by your surgeon. ° °ACTIVITY ° °o Increase activity slowly as tolerated, but follow the weight bearing instructions below.   °o No driving for 6 weeks or until further direction given by your physician.  You cannot drive while taking narcotics.  °o No lifting or carrying greater than 10 lbs. until further directed by your surgeon. °o Avoid periods of inactivity such as sitting longer than an hour when not asleep. This helps prevent blood clots.  °o You may return to work once you are authorized by your doctor.  ° ° ° °WEIGHT BEARING  ° °Weight bearing as tolerated with assist device (walker, cane, etc) as directed, use it as long as suggested by your surgeon or therapist, typically at  least 4-6 weeks. ° ° °EXERCISES ° °Results after joint replacement surgery are often greatly improved when you follow the exercise, range of motion and muscle strengthening exercises prescribed by your doctor. Safety measures are also important to protect the joint from further injury. Any time any of these exercises cause you to have increased pain or swelling, decrease what you are doing until you are comfortable again and then slowly increase them. If you have problems or questions, call your caregiver or physical therapist for advice.  ° °Rehabilitation is important following a joint replacement. After just a few days of immobilization, the muscles of the leg can become weakened and shrink (atrophy).  These exercises are designed to build up the tone and strength of the thigh and leg muscles and to improve motion. Often times heat used for twenty to thirty minutes before working out will loosen up your tissues and help with improving the range of motion but do not use heat for the first two weeks following surgery (sometimes heat can increase post-operative swelling).  ° °These exercises can be done on a training (exercise) mat, on the floor, on a table or on a bed. Use whatever works the best and is most comfortable for you.    Use music or television while you are exercising so that the exercises are a pleasant break in your day. This will make your life better with the exercises acting as a break   in your routine that you can look forward to.   Perform all exercises about fifteen times, three times per day or as directed.  You should exercise both the operative leg and the other leg as well. ° °Exercises include: °  °• Quad Sets - Tighten up the muscle on the front of the thigh (Quad) and hold for 5-10 seconds.   °• Straight Leg Raises - With your knee straight (if you were given a brace, keep it on), lift the leg to 60 degrees, hold for 3 seconds, and slowly lower the leg.  Perform this exercise against  resistance later as your leg gets stronger.  °• Leg Slides: Lying on your back, slowly slide your foot toward your buttocks, bending your knee up off the floor (only go as far as is comfortable). Then slowly slide your foot back down until your leg is flat on the floor again.  °• Angel Wings: Lying on your back spread your legs to the side as far apart as you can without causing discomfort.  °• Hamstring Strength:  Lying on your back, push your heel against the floor with your leg straight by tightening up the muscles of your buttocks.  Repeat, but this time bend your knee to a comfortable angle, and push your heel against the floor.  You may put a pillow under the heel to make it more comfortable if necessary.  ° °A rehabilitation program following joint replacement surgery can speed recovery and prevent re-injury in the future due to weakened muscles. Contact your doctor or a physical therapist for more information on knee rehabilitation.  ° ° °CONSTIPATION ° °Constipation is defined medically as fewer than three stools per week and severe constipation as less than one stool per week.  Even if you have a regular bowel pattern at home, your normal regimen is likely to be disrupted due to multiple reasons following surgery.  Combination of anesthesia, postoperative narcotics, change in appetite and fluid intake all can affect your bowels.  ° °YOU MUST use at least one of the following options; they are listed in order of increasing strength to get the job done.  They are all available over the counter, and you may need to use some, POSSIBLY even all of these options:   ° °Drink plenty of fluids (prune juice may be helpful) and high fiber foods °Colace 100 mg by mouth twice a day  °Senokot for constipation as directed and as needed Dulcolax (bisacodyl), take with full glass of water  °Miralax (polyethylene glycol) once or twice a day as needed. ° °If you have tried all these things and are unable to have a bowel  movement in the first 3-4 days after surgery call either your surgeon or your primary doctor.   ° °If you experience loose stools or diarrhea, hold the medications until you stool forms back up.  If your symptoms do not get better within 1 week or if they get worse, check with your doctor.  If you experience "the worst abdominal pain ever" or develop nausea or vomiting, please contact the office immediately for further recommendations for treatment. ° ° °ITCHING:  If you experience itching with your medications, try taking only a single pain pill, or even half a pain pill at a time.  You can also use Benadryl over the counter for itching or also to help with sleep.  ° °TED HOSE STOCKINGS:  Use stockings on both legs until for at least 2 weeks or as   directed by physician office. They may be removed at night for sleeping.  MEDICATIONS:  See your medication summary on the After Visit Summary that nursing will review with you.  You may have some home medications which will be placed on hold until you complete the course of blood thinner medication.  It is important for you to complete the blood thinner medication as prescribed.  PRECAUTIONS:  If you experience chest pain or shortness of breath - call 911 immediately for transfer to the hospital emergency department.   If you develop a fever greater that 101 F, purulent drainage from wound, increased redness or drainage from wound, foul odor from the wound/dressing, or calf pain - CONTACT YOUR SURGEON.                                                   FOLLOW-UP APPOINTMENTS:  If you do not already have a post-op appointment, please call the office for an appointment to be seen by your surgeon.  Guidelines for how soon to be seen are listed in your After Visit Summary, but are typically between 1-4 weeks after surgery.  OTHER INSTRUCTIONS:   Knee Replacement:  Do not place pillow under knee, focus on keeping the knee straight while resting. CPM  instructions: 0-90 degrees, 2 hours in the morning, 2 hours in the afternoon, and 2 hours in the evening. Place foam block, curve side up under heel at all times except when in CPM or when walking.  DO NOT modify, tear, cut, or change the foam block in any way.  MAKE SURE YOU:   Understand these instructions.   Get help right away if you are not doing well or get worse.    Thank you for letting us be a part of your medical care team.  It is a privilege we respect greatly.  We hope these instructions will help you stay on track for a fast and full recovery!   Information on my medicine - ELIQUIS (apixaban)  This medication education was reviewed with me or my healthcare representative as part of my discharge preparation.  The pharmacist that spoke with me during my hospital stay was:  Tad Moore, [facility]  Why was Eliquis prescribed for you? Eliquis was prescribed for you to reduce the risk of blood clots forming after orthopedic surgery.    What do You need to know about Eliquis? Take your Eliquis TWICE DAILY - one tablet in the morning and one tablet in the evening with or without food.  It would be best to take the dose about the same time each day.  If you have difficulty swallowing the tablet whole please discuss with your pharmacist how to take the medication safely.  Take Eliquis exactly as prescribed by your doctor and DO NOT stop taking Eliquis without talking to the doctor who prescribed the medication.  Stopping without other medication to take the place of Eliquis may increase your risk of developing a clot.  After discharge, you should have regular check-up appointments with your healthcare provider that is prescribing your Eliquis.  What do you do if you miss a dose? If a dose of ELIQUIS is not taken at the scheduled time, take it as soon as possible on the same day and twice-daily administration should be resumed.  The dose should not be doubled  to make up for a  missed dose.  Do not take more than one tablet of ELIQUIS at the same time.  Important Safety Information A possible side effect of Eliquis is bleeding. You should call your healthcare provider right away if you experience any of the following: ? Bleeding from an injury or your nose that does not stop. ? Unusual colored urine (red or dark brown) or unusual colored stools (red or black). ? Unusual bruising for unknown reasons. ? A serious fall or if you hit your head (even if there is no bleeding).  Some medicines may interact with Eliquis and might increase your risk of bleeding or clotting while on Eliquis. To help avoid this, consult your healthcare provider or pharmacist prior to using any new prescription or non-prescription medications, including herbals, vitamins, non-steroidal anti-inflammatory drugs (NSAIDs) and supplements.  This website has more information on Eliquis (apixaban): http://www.eliquis.com/eliquis/home

## 2015-04-20 NOTE — Progress Notes (Signed)
Occupational Therapy Evaluation Patient Details Name: Rhonda Hart MRN: [id number] DOB: 22-May-1945 Today's Date: 04/20/2015    History of Present Illness Patient is a 70 yo female admitted 04/19/15 now s/p Rt TKA.    PMH:  HTN, HLD, Bil THA with revision Rt, arthritis   Clinical Impression   PTA, pt was independent with ADLs and mobility. Pt currently requires min assist for LB ADLs and min guard assist for mobility due to balance deficits and pain. Began education for compensatory strategies for ADLs and home safety/fall prevention strategies. Pt plans to d/c home with 24/7 assistance from her husband and son. Pt will benefit from continued acute OT to increase independence and safety with ADLs and mobility to allow for safe discharge home. No OT follow up or DME recommended at this time.    Follow Up Recommendations  No OT follow up;Supervision/Assistance - 24 hour    Equipment Recommendations  None recommended by OT    Recommendations for Other Services       Precautions / Restrictions Precautions Precautions: Knee Precaution Booklet Issued: No Precaution Comments: Reviewed not placing pillow, ice pack or other object under R knee Required Braces or Orthoses: Knee Immobilizer - Right Knee Immobilizer - Right: On when out of bed or walking (d/c when block wears off) Restrictions Weight Bearing Restrictions: Yes RLE Weight Bearing: Weight bearing as tolerated      Mobility Bed Mobility Overal bed mobility: Needs Assistance Bed Mobility: Supine to Sit     Supine to sit: Min assist     General bed mobility comments: Pt up in chair on OT arrival  Transfers Overall transfer level: Needs assistance Equipment used: Rolling walker (2 wheeled) Transfers: Sit to/from Stand Sit to Stand: Min guard         General transfer comment: No physical assist required - min guard for safety. Good demonstration of safe hand placement on seated surfaces.    Balance Overall  balance assessment: Needs assistance Sitting-balance support: No upper extremity supported;Feet supported Sitting balance-Leahy Scale: Good     Standing balance support: Bilateral upper extremity supported;During functional activity Standing balance-Leahy Scale: Poor                              ADL Overall ADL's : Needs assistance/impaired         Upper Body Bathing: Set up;Sitting   Lower Body Bathing: Minimal assistance;Sit to/from stand   Upper Body Dressing : Set up;Sitting   Lower Body Dressing: Minimal assistance;Sit to/from stand;Cueing for compensatory techniques Lower Body Dressing Details (indicate cue type and reason): Cues to dress RLE first and undress it last Toilet Transfer: Min guard;Ambulation;BSC;RW Toilet Transfer Details (indicate cue type and reason): BSC over toilet Toileting- Clothing Manipulation and Hygiene: Min guard;Sit to/from stand       Functional mobility during ADLs: Min guard;Rolling walker       Vision Vision Assessment?: No apparent visual deficits   Perception     Praxis      Pertinent Vitals/Pain Pain Assessment: 0-10 Pain Score: 9  Pain Location: R knee Pain Descriptors / Indicators: Aching;Sore Pain Intervention(s): Limited activity within patient's tolerance;Monitored during session;Repositioned     Hand Dominance Right   Extremity/Trunk Assessment Upper Extremity Assessment Upper Extremity Assessment: Overall WFL for tasks assessed   Lower Extremity Assessment Lower Extremity Assessment: RLE deficits/detail RLE Deficits / Details: Decreased strength and ROM post-op RLE: Unable to fully assess due to  pain   Cervical / Trunk Assessment Cervical / Trunk Assessment: Normal   Communication Communication Communication: No difficulties   Cognition Arousal/Alertness: Awake/alert Behavior During Therapy: WFL for tasks assessed/performed Overall Cognitive Status: Within Functional Limits for tasks  assessed                     General Comments       Exercises Exercises: Total Joint     Shoulder Instructions      Home Living Family/patient expects to be discharged to:: Private residence Living Arrangements: Spouse/significant other;Children (husband and son) Available Help at Discharge: Family;Available 24 hours/day Type of Home: House Home Access: Stairs to enter [employer] of Steps: 2 Entrance Stairs-Rails: None Home Layout: One level     Bathroom Shower/Tub: Tub/shower unit Shower/tub characteristics: Curtain Biochemist, clinical: Standard     Home Equipment: Environmental consultant - 2 wheels;Cane - single point;Bedside commode;Adaptive equipment Adaptive Equipment: Reacher;Sock aid;Long-handled shoe horn;Long-handled sponge Additional Comments: Husband having health issues      Prior Functioning/Environment Level of Independence: Independent        Comments: Recently had hip surgery and had HH therapy teach her how to use 3in1 for shower seat    OT Diagnosis: Acute pain   OT Problem List: Decreased strength;Decreased range of motion;Impaired balance (sitting and/or standing);Decreased activity tolerance;Decreased safety awareness;Decreased knowledge of precautions;Decreased knowledge of use of DME or AE;Pain   OT Treatment/Interventions: Self-care/ADL training;Therapeutic exercise;Energy conservation;DME and/or AE instruction;Therapeutic activities;Patient/family education;Balance training    OT Goals(Current goals can be found in the care plan section) Acute Rehab OT Goals Patient Stated Goal: to go home OT Goal Formulation: With patient Time For Goal Achievement: 05/04/15 Potential to Achieve Goals: Good ADL Goals Pt Will Perform Lower Body Bathing: sit to/from stand;with modified independence Pt Will Perform Lower Body Dressing: with modified independence;sit to/from stand Pt Will Transfer to Toilet: with modified independence;ambulating;bedside  commode (over toilet) Pt Will Perform Toileting - Clothing Manipulation and hygiene: with modified independence;sitting/lateral leans;sit to/from stand Pt Will Perform Tub/Shower Transfer: Tub transfer;with supervision;ambulating;3 in 1;rolling walker  OT Frequency: Min 2X/week   Barriers to D/C:            Co-evaluation              End of Session Equipment Utilized During Treatment: Gait belt;Rolling walker;Right knee immobilizer CPM Right Knee CPM Right Knee: Off Right Knee Flexion (Degrees): 40 Right Knee Extension (Degrees): 10 Nurse Communication: Mobility status  Activity Tolerance: Patient tolerated treatment well Patient left: in chair;with call bell/phone within reach   Time: 1217-1226 OT Time Calculation (min): 9 min Charges:  OT General Charges $OT Visit: 1 Procedure OT Evaluation $OT Eval Moderate Complexity: 1 Procedure G-Codes:    Redmond Baseman, OTR/L [phone] 04/20/2015, 1:37 PM

## 2015-04-20 NOTE — Progress Notes (Signed)
Physical Therapy Treatment Patient Details Name: Rhonda Hart MRN: [id number] DOB: [birth year] Today's Date: 04/20/2015    History of Present Illness Patient is a 70 yo female admitted 04/19/15 now s/p Rt TKA.    PMH:  HTN, HLD, Bil THA with revision Rt, arthritis    PT Comments    Patient making progress with mobility and gait.  Follow Up Recommendations  Home health PT;Supervision/Assistance - 24 hour     Equipment Recommendations  None recommended by PT    Recommendations for Other Services       Precautions / Restrictions Precautions Precautions: Knee Precaution Comments: Reviewed precautions Required Braces or Orthoses: Knee Immobilizer - Right Knee Immobilizer - Right: On when out of bed or walking (d/c when block wears off) Restrictions Weight Bearing Restrictions: Yes RLE Weight Bearing: Weight bearing as tolerated    Mobility  Bed Mobility Overal bed mobility: Needs Assistance Bed Mobility: Supine to Sit     Supine to sit: Min assist     General bed mobility comments: Verbal cues for technique.  Assist to bring RLE off of bed and raise trunk to sitting.  Transfers Overall transfer level: Needs assistance Equipment used: Rolling walker (2 wheeled) Transfers: Sit to/from Stand Sit to Stand: Min guard         General transfer comment: Verbal cues for hand placement.  Assist for safety.  Ambulation/Gait Ambulation/Gait assistance: Min guard Ambulation Distance (Feet): 40 Feet Assistive device: Rolling walker (2 wheeled) Gait Pattern/deviations: Step-to pattern;Decreased stance time - right;Decreased step length - left;Decreased step length - right;Decreased stride length;Decreased weight shift to right;Antalgic Gait velocity: decreased Gait velocity interpretation: Below normal speed for age/gender General Gait Details: Verbal cues for gait sequence.   Stairs            Wheelchair Mobility    Modified Rankin (Stroke Patients Only)        Balance                                    Cognition Arousal/Alertness: Awake/alert Behavior During Therapy: WFL for tasks assessed/performed Overall Cognitive Status: Within Functional Limits for tasks assessed                      Exercises Total Joint Exercises Ankle Circles/Pumps: AROM;Both;10 reps;Supine Quad Sets: AROM;Right;5 reps;Supine Heel Slides: AAROM;Right;5 reps;Supine Hip ABduction/ADduction: AROM;Right;10 reps;Supine    General Comments        Pertinent Vitals/Pain Pain Assessment: 0-10 Pain Score: 8  (while in foam block.  Decreased once up) Pain Location: Rt knee Pain Descriptors / Indicators: Aching;Sore (Pulling) Pain Intervention(s): Monitored during session;Repositioned    Home Living                      Prior Function            PT Goals (current goals can now be found in the care plan section) Progress towards PT goals: Progressing toward goals    Frequency  7X/week    PT Plan Current plan remains appropriate    Co-evaluation             End of Session Equipment Utilized During Treatment: Gait belt Activity Tolerance: Patient limited by pain Patient left: in chair;with call bell/phone within reach     Time: [id number] PT Time Calculation (min) (ACUTE ONLY): 18 min  Charges:  $Gait Training: 8-22 mins  G Codes:      Despina Pole 04/20/2015, 12:23 PM Carita Pian. Sanjuana Kava, [place] Pager [phone]

## 2015-04-20 NOTE — Progress Notes (Signed)
Physical Therapy Treatment Patient Details Name: Rhonda Hart MRN: [id number] DOB: [birth year] Today's Date: 04/20/2015    History of Present Illness Patient is a 70 yo female admitted 04/19/15 now s/p Rt TKA.    PMH:  HTN, HLD, Bil THA with revision Rt, arthritis    PT Comments    Patient able to ambulate 45' today with RW and min guard assist.  Making improvements.  Follow Up Recommendations  Home health PT;Supervision/Assistance - 24 hour     Equipment Recommendations  None recommended by PT    Recommendations for Other Services       Precautions / Restrictions Precautions Precautions: Knee Precaution Booklet Issued: No Precaution Comments: Reviewed not placing pillow, ice pack or other object under R knee Required Braces or Orthoses: Knee Immobilizer - Right Knee Immobilizer - Right: On when out of bed or walking (d/c when block wears off) Restrictions Weight Bearing Restrictions: Yes RLE Weight Bearing: Weight bearing as tolerated    Mobility  Bed Mobility Overal bed mobility: Needs Assistance Bed Mobility: Supine to Sit;Sit to Supine     Supine to sit: Min assist Sit to supine: Min assist   General bed mobility comments: Assist to move RLE off of bed.  Patient able to use UE's to move to sitting position.  Assist to bring RLE onto bed to return to supine.  Transfers Overall transfer level: Needs assistance Equipment used: Rolling walker (2 wheeled) Transfers: Sit to/from Stand Sit to Stand: Min guard         General transfer comment: Assist for safety only from bed and BSC  Ambulation/Gait Ambulation/Gait assistance: Min guard Ambulation Distance (Feet): 90 Feet Assistive device: Rolling walker (2 wheeled) Gait Pattern/deviations: Step-to pattern;Decreased stance time - right;Decreased step length - left;Decreased step length - right;Decreased stride length;Decreased weight shift to right;Antalgic Gait velocity: decreased Gait velocity  interpretation: Below normal speed for age/gender General Gait Details: Cues to put Rt foot flat on floor.  Patient attempted to void on [facility] x2 with very minimal results.  RN notified.   Stairs            Wheelchair Mobility    Modified Rankin (Stroke Patients Only)       Balance Overall balance assessment: Needs assistance Sitting-balance support: No upper extremity supported;Feet supported Sitting balance-Leahy Scale: Good     Standing balance support: Bilateral upper extremity supported;During functional activity Standing balance-Leahy Scale: Poor                      Cognition Arousal/Alertness: Awake/alert Behavior During Therapy: WFL for tasks assessed/performed Overall Cognitive Status: Within Functional Limits for tasks assessed                      Exercises Total Joint Exercises Ankle Circles/Pumps: AROM;Both;10 reps;Supine Quad Sets: AROM;Right;10 reps;Supine Heel Slides: AAROM;Right;10 reps;Supine Hip ABduction/ADduction: AROM;Right;10 reps;Supine    General Comments        Pertinent Vitals/Pain Pain Assessment: 0-10 Pain Score: 6  Pain Location: Rt knee Pain Descriptors / Indicators: Aching;Sore Pain Intervention(s): Monitored during session;Repositioned;Patient requesting pain meds-RN notified    Home Living Family/patient expects to be discharged to:: Private residence Living Arrangements: Spouse/significant other;Children (husband and son) Available Help at Discharge: Family;Available 24 hours/day Type of Home: House Home Access: Stairs to enter Entrance Stairs-Rails: None Home Layout: One level Home Equipment: Environmental consultant - 2 wheels;Cane - single point;Bedside commode;Adaptive equipment Additional Comments: Husband having health issues    Prior  Function Level of Independence: Independent      Comments: Recently had hip surgery and had HH therapy teach her how to use 3in1 for shower seat   PT Goals (current goals can now  be found in the care plan section) Acute Rehab PT Goals Patient Stated Goal: to go home Progress towards PT goals: Progressing toward goals    Frequency  7X/week    PT Plan Current plan remains appropriate    Co-evaluation             End of Session Equipment Utilized During Treatment: Gait belt Activity Tolerance: Patient limited by pain Patient left: in bed;with call bell/phone within reach;with nursing/sitter in room;with family/visitor present     Time: [id number] PT Time Calculation (min) (ACUTE ONLY): 30 min  Charges:  $Gait Training: 8-22 mins $Therapeutic Exercise: 8-22 mins                    G Codes:      Despina Pole 05-10-2015, 4:09 PM Carita Pian. Sanjuana Kava, [place] Pager [phone]

## 2015-04-21 DIAGNOSIS — N390 Urinary tract infection, site not specified: Secondary | ICD-10-CM | POA: Clinically undetermined

## 2015-04-21 LAB — CBC
HCT: 28 % — ABNORMAL LOW (ref 36.0–46.0)
Hemoglobin: 9.5 g/dL — ABNORMAL LOW (ref 12.0–15.0)
MCH: 30.7 pg (ref 26.0–34.0)
MCHC: 33.9 g/dL (ref 30.0–36.0)
MCV: 90.6 fL (ref 78.0–100.0)
PLATELETS: 209 10*3/uL (ref 150–400)
RBC: 3.09 MIL/uL — AB (ref 3.87–5.11)
RDW: 14.1 % (ref 11.5–15.5)
WBC: 10.6 10*3/uL — AB (ref 4.0–10.5)

## 2015-04-21 LAB — URINALYSIS, ROUTINE W REFLEX MICROSCOPIC
Bilirubin Urine: NEGATIVE
GLUCOSE, UA: NEGATIVE mg/dL
Ketones, ur: NEGATIVE mg/dL
NITRITE: NEGATIVE
PROTEIN: 30 mg/dL — AB
Specific Gravity, Urine: 1.017 (ref 1.005–1.030)
pH: 6 (ref 5.0–8.0)

## 2015-04-21 LAB — URINE MICROSCOPIC-ADD ON: BACTERIA UA: NONE SEEN

## 2015-04-21 MED ORDER — NITROFURANTOIN MONOHYD MACRO 100 MG PO CAPS
100.0000 mg | ORAL_CAPSULE | Freq: Two times a day (BID) | ORAL | Status: DC
  Administered 2015-04-21 – 2015-04-22 (×3): 100 mg via ORAL
  Filled 2015-04-21 (×3): qty 1

## 2015-04-21 NOTE — Progress Notes (Signed)
Orthopaedic Trauma [facility] Progress Note  Subjective  Comfortable, pain controlled No specific complaints Foley placed yesterday, remains in  Has not been in CPM today   Review of Systems  Constitutional: Negative for fever and chills.  Respiratory: Negative for shortness of breath and wheezing.   Cardiovascular: Negative for chest pain and palpitations.  Gastrointestinal: Negative for nausea, vomiting and abdominal pain.  Neurological: Negative for tingling and sensory change.     Objective   BP 143/63 mmHg  Pulse 84  Temp(Src) 98.9 F (37.2 C) (Oral)  Resp 16  Ht 5\' 1"  (1.549 m)  Wt 75.694 kg (166 lb 14 oz)  BMI 31.55 kg/m2  SpO2 100%  Intake/Output      04/08 0701 - 04/09 0700 04/09 0701 - 04/10 0700   P.O. 960 120   I.V. (mL/kg)     Total Intake(mL/kg) 960 (12.7) 120 (1.6)   Urine (mL/kg/hr) 2300 (1.3)    Blood     Total Output 2300     Net -1340 +120          Labs Results for Rhonda Hart, Rhonda Hart (MRN [id number]) as of 04/21/2015 09:21  Ref. Range 04/21/2015 06:24  WBC Latest Ref Range: 4.0-10.5 K/uL 10.6 (H)  RBC Latest Ref Range: 3.87-5.11 MIL/uL 3.09 (L)  Hemoglobin Latest Ref Range: 12.0-15.0 g/dL 9.5 (L)  HCT Latest Ref Range: 36.0-46.0 % 28.0 (L)  MCV Latest Ref Range: 78.0-100.0 fL 90.6  MCH Latest Ref Range: 26.0-34.0 pg 30.7  MCHC Latest Ref Range: 30.0-36.0 g/dL 33.9  RDW Latest Ref Range: 11.5-15.5 % 14.1  Platelets Latest Ref Range: 150-400 K/uL 209   Urinalysis pending   Exam  Gen: in bed, NAD   Ext:        Right Lower Extremity               Dressing c/d/i  Incision stable, no drainage, no signs of infection              Ext warm             Motor and sensory functions intact             EHL, FHL, AT, PT, peroneals, gastroc motor intact             DPN, SPN, TN sensation intact             No DCT               Compartments soft      Assessment and Plan   POD/HD#: 2   70 y/o black female with 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   -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:             WBAT             PT/OT evals             Ice and elevate             Bone foam to help knee get to full extension               CPM    Dressing changed, will order TED hose               - Pain management:             Continue with current regiment  - ABL anemia/Hemodynamics  BP improved, continue to monitor   - Medical issues               Urinary retention                           u/a pending    Foley in place ( there is no documentation in chart indicating that a foley was replaced)   Pt will need voiding trial tomorrow   Continue urecholine               Chronic medical issues                         Continue home meds  - DVT/PE prophylaxis:             apixaban - ID:               periop abx completed    - Activity:             WBAT             PT/OT   - FEN/GI prophylaxis/Foley/Lines:             Diet as tolerated                - Dispo:             Therapy  Possible dc tomorrow   Dr. Mare Ferrari to see tomorrow    [name], PA-C [facility] [phone] (O) 04/21/2015 9:19 AM

## 2015-04-21 NOTE — Progress Notes (Signed)
Occupational Therapy Treatment Patient Details Name: Rhonda Hart MRN: [id number] DOB: 29-Apr-1945 Today's Date: 04/21/2015    History of present illness Patient is a 70 yo female admitted 04/19/15 now s/p Rt TKA.    PMH:  HTN, HLD, Bil THA with revision Rt, arthritis   OT comments  Pt. Able to completed tub transfer this session with min a from sister who will also be assisting at home.  Pt. C/o knee pain throughout session and required encouragement to complete desired tasks.  Will continue to follow acutely.    Follow Up Recommendations  No OT follow up;Supervision/Assistance - 24 hour    Equipment Recommendations  None recommended by OT    Recommendations for Other Services      Precautions / Restrictions Precautions Precautions: Knee Precaution Comments: Reviewed not placing pillow, ice pack or other object under R knee Knee Immobilizer - Right: Other (comment) (pt. declined KI states she does not need it anymore) Restrictions RLE Weight Bearing: Weight bearing as tolerated       Mobility Bed Mobility: pt. Able to complete with CGA hob flat, no rails, exits on R side.  Supine to sit then guided B les out of bed and scooted hips to eob.                     Transfers                      Balance                                   ADL Overall ADL's : Needs assistance/impaired               Lower Body Bathing Details (indicate cue type and reason): sister present and states she will be assisting with all LB ADLS as needed       Lower Body Dressing Details (indicate cue type and reason): sister present and states she will be assisting with all LB ADLS as needed Toilet Transfer: Min guard;Ambulation Toilet Transfer Details (indicate cue type and reason): simulated during transfer in room      Tub/ Shower Transfer: Tub transfer;Minimal assistance;Cueing for sequencing;3 in 1 Tub/Shower Transfer Details (indicate cue type and  reason): sister took the lead on guiding R LE in/out of tub, will be main caregiver for bathing and tub transfer needs at home Functional mobility during ADLs: Min guard;Rolling walker General ADL Comments: max encouragement for participation in therapy.  c/o pain. sister helpful in encouraging attempts      Vision                     Perception     Praxis      Cognition                             Extremity/Trunk Assessment               Exercises     Shoulder Instructions       General Comments      Pertinent Vitals/ Pain          Home Living  Prior Functioning/Environment              Frequency Min 2X/week     Progress Toward Goals  OT Goals(current goals can now be found in the care plan section)  Progress towards OT goals: Progressing toward goals     Plan Discharge plan remains appropriate    Co-evaluation                 End of Session Equipment Utilized During Treatment: Gait belt;Rolling walker   Activity Tolerance Patient tolerated treatment well   Patient Left in chair;with call bell/phone within reach;with family/visitor present   Nurse Communication          Time: [id number] OT Time Calculation (min): 27 min  Charges: OT General Charges $OT Visit: 1 Procedure OT Treatments $Self Care/Home Management : 23-37 mins  Janice Coffin, COTA/L 04/21/2015, 10:16 AM

## 2015-04-21 NOTE — Progress Notes (Signed)
Physical Therapy Treatment Patient Details Name: Rhonda Hart MRN: [id number] DOB: 11-23-1945 Today's Date: 04/21/2015    History of Present Illness Patient is a 70 yo female admitted 04/19/15 now s/p Rt TKA.    PMH:  HTN, HLD, Bil THA with revision Rt, arthritis    PT Comments    Patient making progress with mobility and gait.    Follow Up Recommendations  Home health PT;Supervision/Assistance - 24 hour     Equipment Recommendations  None recommended by PT    Recommendations for Other Services       Precautions / Restrictions Precautions Precautions: Knee Required Braces or Orthoses: Knee Immobilizer - Right Knee Immobilizer - Right: Other (comment) (d/c'ed since block worn off) Restrictions Weight Bearing Restrictions: Yes RLE Weight Bearing: Weight bearing as tolerated    Mobility  Bed Mobility Overal bed mobility: Needs Assistance Bed Mobility: Supine to Sit     Supine to sit: Min assist     General bed mobility comments: Assist to move RLE off of bed.  Transfers Overall transfer level: Needs assistance Equipment used: Rolling walker (2 wheeled) Transfers: Sit to/from Stand Sit to Stand: Min guard         General transfer comment: Assist for safety only  Ambulation/Gait Ambulation/Gait assistance: Min guard Ambulation Distance (Feet): 90 Feet Assistive device: Rolling walker (2 wheeled) Gait Pattern/deviations: Step-to pattern;Decreased stance time - right;Decreased step length - left;Decreased step length - right;Decreased weight shift to right;Antalgic Gait velocity: decreased Gait velocity interpretation: Below normal speed for age/gender General Gait Details: Verbal cues to take longer steps with RLE.     Stairs            Wheelchair Mobility    Modified Rankin (Stroke Patients Only)       Balance                                    Cognition Arousal/Alertness: Awake/alert Behavior During Therapy: WFL for tasks  assessed/performed Overall Cognitive Status: Within Functional Limits for tasks assessed                      Exercises Total Joint Exercises Quad Sets: AROM;Right;10 reps;Seated Knee Flexion: AROM;Right;10 reps;Seated Goniometric ROM: -15 ext to 75* flex    General Comments        Pertinent Vitals/Pain Pain Assessment: 0-10 Pain Score: 7  Pain Location: Rt knee Pain Descriptors / Indicators: Aching;Sore Pain Intervention(s): Monitored during session;Repositioned    Home Living                      Prior Function            PT Goals (current goals can now be found in the care plan section) Progress towards PT goals: Progressing toward goals    Frequency  7X/week    PT Plan Current plan remains appropriate    Co-evaluation             End of Session Equipment Utilized During Treatment: Gait belt Activity Tolerance: Patient limited by pain Patient left: in chair;with call bell/phone within reach;with family/visitor present     Time: [id number] PT Time Calculation (min) (ACUTE ONLY): 23 min  Charges:  $Gait Training: 23-37 mins                    G CodesRosana Hoes,  Carita Pian 04/21/2015, 2:22 PM Carita Pian Sanjuana Kava, [place] Pager [phone]

## 2015-04-21 NOTE — Progress Notes (Signed)
Orthopedic Tech Progress Note Patient Details:  Rhonda Hart 05/07/1945 [id number]  Patient ID: [name], female   DOB: 1945/04/07, 70 y.o.   MRN: [id number] Patient refused cpm will call when ready  Karolee Stamps 04/21/2015, 5:17 AM

## 2015-04-21 NOTE — Progress Notes (Signed)
Physical Therapy Treatment Patient Details Name: Rhonda Hart MRN: [id number] DOB: 21-May-1945 Today's Date: 04/21/2015    History of Present Illness Patient is a 70 yo female admitted 04/19/15 now s/p Rt TKA.    PMH:  HTN, HLD, Bil THA with revision Rt, arthritis    PT Comments    Patient making improvements with mobility and gait.  Able to negotiate 1 step with min assist.  Pain continues to limit progress.  Follow Up Recommendations  Home health PT;Supervision/Assistance - 24 hour     Equipment Recommendations  None recommended by PT    Recommendations for Other Services       Precautions / Restrictions Precautions Precautions: Knee Precaution Comments: Reviewed precautions Required Braces or Orthoses: Knee Immobilizer - Right Knee Immobilizer - Right: Other (comment) (D/C'ed - block wore off) Restrictions Weight Bearing Restrictions: Yes RLE Weight Bearing: Weight bearing as tolerated    Mobility  Bed Mobility Overal bed mobility: Needs Assistance Bed Mobility: Supine to Sit;Sit to Supine     Supine to sit: Min guard Sit to supine: Min assist   General bed mobility comments: Patient able to move to sitting with increased time and no physical assist.  Required min assist to bring RLE onto bed to return to supine.  Attempted to have patient use LLE to assist RLE - patient unable.  Transfers Overall transfer level: Needs assistance Equipment used: Rolling walker (2 wheeled) Transfers: Sit to/from Stand Sit to Stand: Min guard         General transfer comment: Assist for safety only  Ambulation/Gait Ambulation/Gait assistance: Min guard Ambulation Distance (Feet): 90 Feet Assistive device: Rolling walker (2 wheeled) Gait Pattern/deviations: Step-to pattern;Decreased stride length;Decreased weight shift to right;Antalgic Gait velocity: decreased Gait velocity interpretation: Below normal speed for age/gender General Gait Details: Cues to focus on taking  longer steps.  Patient was able to increase stride length this pm.   Stairs Stairs: Yes Stairs assistance: Min assist Stair Management: No rails;Step to pattern;Forwards;With walker Number of Stairs: 1 General stair comments: Instructed patient on negotiating one step with RW.  Required min assist with single-leg stance on Rt while stepping up with LLE.   Wheelchair Mobility    Modified Rankin (Stroke Patients Only)       Balance                                    Cognition Arousal/Alertness: Awake/alert Behavior During Therapy: WFL for tasks assessed/performed Overall Cognitive Status: Within Functional Limits for tasks assessed                      Exercises Total Joint Exercises Ankle Circles/Pumps: AROM;Both;10 reps;Supine Quad Sets: AROM;Right;10 reps;Supine Knee Flexion: AROM;Right;10 reps;Seated Goniometric ROM: -15 ext to 75* flex    General Comments        Pertinent Vitals/Pain Pain Assessment: 0-10 Pain Score: 8  Pain Location: Rt knee Pain Descriptors / Indicators: Aching;Sore Pain Intervention(s): Monitored during session;Repositioned;Patient requesting pain meds-RN notified    Home Living                      Prior Function            PT Goals (current goals can now be found in the care plan section) Progress towards PT goals: Progressing toward goals    Frequency  7X/week    PT Plan Current  plan remains appropriate    Co-evaluation             End of Session Equipment Utilized During Treatment: Gait belt Activity Tolerance: Patient limited by pain Patient left: in bed;with call bell/phone within reach;with family/visitor present     Time: [id number] PT Time Calculation (min) (ACUTE ONLY): 23 min  Charges:  $Gait Training: 8-22 mins $Therapeutic Activity: 8-22 mins                    G Codes:      Despina Pole 15-May-2015, 5:02 PM Carita Pian. Sanjuana Kava, [place] Pager  [phone]

## 2015-04-22 LAB — CBC
HEMATOCRIT: 27.4 % — AB (ref 36.0–46.0)
Hemoglobin: 9.3 g/dL — ABNORMAL LOW (ref 12.0–15.0)
MCH: 30.4 pg (ref 26.0–34.0)
MCHC: 33.9 g/dL (ref 30.0–36.0)
MCV: 89.5 fL (ref 78.0–100.0)
Platelets: 204 10*3/uL (ref 150–400)
RBC: 3.06 MIL/uL — ABNORMAL LOW (ref 3.87–5.11)
RDW: 13.9 % (ref 11.5–15.5)
WBC: 10.5 10*3/uL (ref 4.0–10.5)

## 2015-04-22 LAB — URINE CULTURE: CULTURE: NO GROWTH

## 2015-04-22 MED ORDER — NITROFURANTOIN MONOHYD MACRO 100 MG PO CAPS: 100.0000 mg | ORAL_CAPSULE | Freq: Two times a day (BID) | ORAL | Status: DC

## 2015-04-22 NOTE — Progress Notes (Signed)
Occupational Therapy Treatment/Discharge Patient Details Name: Rhonda Hart MRN: [id number] DOB: [birth year] Today's Date: 04/22/2015    History of present illness Patient is a 70 yo female admitted 04/19/15 now s/p Rt TKA.    PMH:  HTN, HLD, Bil THA with revision Rt, arthritis   OT comments  Reviewed and practice tub transfer with 3in1, compensatory strategies for ADLs, energy conservation, gradually increasing activity level, and fall prevention strategies. All education has been completed and pt has no further questions. Pt with no further OT needs. OT signing off.    Follow Up Recommendations  No OT follow up;Supervision/Assistance - 24 hour    Equipment Recommendations  None recommended by OT    Recommendations for Other Services      Precautions / Restrictions Precautions Precautions: Knee Precaution Booklet Issued: No Precaution Comments: Reviewed not placing pillow, ice pack or other object under R knee Restrictions Weight Bearing Restrictions: Yes RLE Weight Bearing: Weight bearing as tolerated       Mobility Bed Mobility Overal bed mobility: Needs Assistance Bed Mobility: Supine to Sit     Supine to sit: Min assist     General bed mobility comments: Min assist with hand under R heel to assist moving RLE off bed.   Transfers Overall transfer level: Needs assistance Equipment used: Rolling walker (2 wheeled) Transfers: Sit to/from Stand Sit to Stand: Min guard         General transfer comment: Min guard for safety only. No physical assist required.    Balance Overall balance assessment: Needs assistance Sitting-balance support: No upper extremity supported;Feet supported Sitting balance-Leahy Scale: Good     Standing balance support: During functional activity;No upper extremity supported Standing balance-Leahy Scale: Poor Standing balance comment: Able to maintain balance for 1 minute without UE support during static standing tasks                   ADL Overall ADL's : Needs assistance/impaired                 Upper Body Dressing : Supervision/safety;Sitting   Lower Body Dressing: Minimal assistance;With caregiver independent assisting;Sit to/from stand;Cueing for compensatory techniques Lower Body Dressing Details (indicate cue type and reason): Cues to dress RLE first and undress it last, sister will assist Toilet Transfer: Min guard;Ambulation;BSC;RW   Toileting- Architect and Hygiene: Min guard;Sit to/from stand   Tub/ Shower Transfer: Tub transfer;Minimal assistance;With caregiver independent assisting;Ambulation;3 in 1;Rolling walker Tub/Shower Transfer Details (indicate cue type and reason): Good demonstation of technique using 3in1 as seat, assist for RLE Functional mobility during ADLs: Min guard;Rolling walker General ADL Comments: Provided handout for proper use of 3in1  in tub as a shower seat. Reviewed fall prevention and energy conservation strategies.      Vision                     Perception     Praxis      Cognition   Behavior During Therapy: WFL for tasks assessed/performed Overall Cognitive Status: Within Functional Limits for tasks assessed                       Extremity/Trunk Assessment               Exercises     Shoulder Instructions       General Comments      Pertinent Vitals/ Pain       Pain Assessment: 0-10 Pain Score:  10-Worst pain ever ("90") Pain Location: R knee Pain Descriptors / Indicators: Aching Pain Intervention(s): Limited activity within patient's tolerance;Monitored during session;Repositioned  Home Living                                          Prior Functioning/Environment              Frequency       Progress Toward Goals  OT Goals(current goals can now be found in the care plan section)  Progress towards OT goals: Goals met/education completed, patient discharged from OT  Acute  Rehab OT Goals Patient Stated Goal: to go home OT Goal Formulation: With patient Time For Goal Achievement: 05/04/15 Potential to Achieve Goals: Good ADL Goals Pt Will Perform Lower Body Bathing: sit to/from stand;with modified independence Pt Will Perform Lower Body Dressing: with modified independence;sit to/from stand Pt Will Transfer to Toilet: with modified independence;ambulating;bedside commode Pt Will Perform Toileting - Clothing Manipulation and hygiene: with modified independence;sitting/lateral leans;sit to/from stand Pt Will Perform Tub/Shower Transfer: Tub transfer;with supervision;ambulating;3 in 1;rolling walker  Plan All goals met and education completed, patient discharged from OT services    Co-evaluation                 End of Session Equipment Utilized During Treatment: Gait belt;Rolling walker   Activity Tolerance Patient tolerated treatment well   Patient Left in chair;with call bell/phone within reach   Nurse Communication Mobility status        Time: 9833-8250 OT Time Calculation (min): 18 min  Charges: OT General Charges $OT Visit: 1 Procedure OT Treatments $Self Care/Home Management : 8-22 mins  Redmond Baseman, OTR/L Pager: [phone] 04/22/2015, 9:16 AM

## 2015-04-22 NOTE — Care Management Important Message (Signed)
Important Message  Patient Details  Name: Rhonda Hart MRN: [id number] Date of Birth: Feb 14, 1945   Medicare Important Message Given:  Yes    Nathen May 04/22/2015, 12:49 PM

## 2015-04-22 NOTE — Discharge Summary (Signed)
PATIENT ID: Rhonda Hart        MRN:  [id number]          DOB/AGE: 17-Jun-1945 / 70 y.o.    DISCHARGE SUMMARY  ADMISSION DATE:    04/19/2015 DISCHARGE DATE:   04/22/2015   ADMISSION DIAGNOSIS: OA RIGHT KNEE    DISCHARGE DIAGNOSIS:  OA RIGHT KNEE    ADDITIONAL DIAGNOSIS: Active Problems:   Primary localized osteoarthritis of right knee   UTI (urinary tract infection)  Past Medical History  Diagnosis Date  . Hypertension     takes Hyzaar,HCTZ,and Metoprolol daily  . Hyperlipidemia     has never been on meds  . Arthritis   . Joint pain   . History of colon polyps     benign    PROCEDURE: Procedure(s): RIGHT TOTAL KNEE ARTHROPLASTY Right on 04/19/2015  CONSULTS: PT/OT      HISTORY:  See H&P in chart  HOSPITAL COURSE:  Rhonda Hart is a 70 y.o. admitted on 04/19/2015 and found to have a diagnosis of OA RIGHT KNEE.  After appropriate laboratory studies were obtained  they were taken to the operating room on 04/19/2015 and underwent  Procedure(s): RIGHT TOTAL KNEE ARTHROPLASTY  Right.   They were given perioperative antibiotics:  Anti-infectives    Start     Dose/Rate Route Frequency Ordered Stop   04/19/15 1630  ceFAZolin (ANCEF) IVPB 1 g/50 mL premix     1 g 100 mL/hr over 30 Minutes Intravenous Every 6 hours 04/19/15 1246 04/20/15 0154   04/19/15 1000  ceFAZolin (ANCEF) IVPB 2g/100 mL premix     2 g 200 mL/hr over 30 Minutes Intravenous To ShortStay Surgical 04/18/15 1028 04/19/15 1054    .  Tolerated the procedure well.    POD #1, allowed out of bed to a chair.  PT for ambulation and exercise program.  In and out cath x2 for urinary retention.  IV saline locked.  O2 discontionued.  POD #2, continued PT and ambulation.  Foley placed continued urinary retention.  UA taken started on Macrobid for likely UTI.   Marland Kitchen POD#3, d/c foley voiding trials discharged home once voiding.  The remainder of the hospital course was dedicated to ambulation and strengthening.    The patient was discharged on 3 Days Post-Op in  Stable condition.  Blood products given:none  DIAGNOSTIC STUDIES: Recent vital signs: Patient Vitals for the past 24 hrs:  BP Temp Temp src Pulse Resp SpO2  04/22/15 0550 (!) 164/73 mmHg 99.9 F (37.7 C) Oral 85 16 98 %  04/21/15 2100 (!) 158/65 mmHg 99.3 F (37.4 C) Oral 80 - 95 %  04/21/15 2000 (!) 163/75 mmHg 100 F (37.8 C) Oral 85 18 98 %  04/21/15 1700 (!) 164/68 mmHg (!) 101.3 F (38.5 C) - 79 - 98 %       Recent laboratory studies:  Recent Labs  04/20/15 0436 04/21/15 0624 04/22/15 0534  WBC 8.3 10.6* 10.5  HGB 10.8* 9.5* 9.3*  HCT 33.2* 28.0* 27.4*  PLT 233 209 204    Recent Labs  04/20/15 0436  NA 138  K 3.6  CL 105  CO2 21*  BUN 15  CREATININE 0.74  GLUCOSE 102*  CALCIUM 8.6*   Lab Results  Component Value Date   INR 1.07 04/08/2015   INR 1.03 11/20/2014   INR 0.98 03/26/2013     Recent Radiographic Studies :  No results found.  DISCHARGE INSTRUCTIONS:   DISCHARGE MEDICATIONS:  Medication List    STOP taking these medications        aspirin EC 81 MG tablet     HYDROcodone-acetaminophen 7.5-325 MG tablet  Commonly known as:  NORCO      TAKE these medications        amLODipine 2.5 MG tablet  Commonly known as:  NORVASC  Take 2.5 mg by mouth daily.     apixaban 2.5 MG Tabs tablet  Commonly known as:  ELIQUIS  Take 1 tablet (2.5 mg total) by mouth 2 (two) times daily.     losartan-hydrochlorothiazide 100-25 MG tablet  Commonly known as:  HYZAAR  Take 1 tablet by mouth daily.     metoprolol succinate 50 MG 24 hr tablet  Commonly known as:  TOPROL-XL  Take 50 mg by mouth daily.     nitrofurantoin (macrocrystal-monohydrate) 100 MG capsule  Commonly known as:  MACROBID  Take 1 capsule (100 mg total) by mouth every 12 (twelve) hours.     oxyCODONE-acetaminophen 5-325 MG tablet  Commonly known as:  ROXICET  Take 1-2 tablets by mouth every 4 (four) hours as needed for  moderate pain or severe pain.        FOLLOW UP VISIT:       Follow-up Information    Follow up with CAFFREY JR,W D, MD. Schedule an appointment as soon as possible for a visit in 2 weeks.   Specialty:  Orthopedic Surgery   Contact information:   [place] [address] [phone]       Follow up with [facility].   Why:  They will contact you to schedule home therapy visits.   Contact information:   [address] [place] [address] [phone]       DISPOSITION:   Home  CONDITION:  Stable   Chriss Czar, PA-C  04/22/2015 8:50 AM

## 2015-04-22 NOTE — Progress Notes (Signed)
Physical Therapy Treatment Patient Details Name: Rhonda Hart MRN: [id number] DOB: [birth year] Today's Date: 04/22/2015    History of Present Illness Patient is a 70 yo female admitted 04/19/15 now s/p Rt TKA.    PMH:  HTN, HLD, Bil THA with revision Rt, arthritis    PT Comments    Patient is making good progress with PT.  From a mobility standpoint anticipate patient will be ready for DC home when medically ready.     Follow Up Recommendations  Home health PT;Supervision/Assistance - 24 hour     Equipment Recommendations  None recommended by PT    Recommendations for Other Services       Precautions / Restrictions Precautions Precautions: Knee Precaution Booklet Issued: No Precaution Comments: Reviewed precautions Required Braces or Orthoses: Knee Immobilizer - Right Knee Immobilizer - Right: Other (comment) (D/C'ed - block wore off) Restrictions Weight Bearing Restrictions: Yes RLE Weight Bearing: Weight bearing as tolerated    Mobility  Bed Mobility Overal bed mobility: Needs Assistance Bed Mobility: Sit to Supine     Supine to sit: Min assist Sit to supine: Min assist   General bed mobility comments: assist to bring R LE into bed; HOB flat and no use of rails; pt able to position in bed with no assist  Transfers Overall transfer level: Needs assistance Equipment used: Rolling walker (2 wheeled) Transfers: Sit to/from Stand Sit to Stand: Min guard         General transfer comment: min guard for safety; increased time to achieve erect posture; cues for hand placement; good technique  Ambulation/Gait Ambulation/Gait assistance: Supervision Ambulation Distance (Feet): 150 Feet Assistive device: Rolling walker (2 wheeled) Gait Pattern/deviations: Step-through pattern;Decreased stance time - right;Decreased step length - left;Decreased stride length;Antalgic;Trunk flexed Gait velocity: decreased   General Gait Details: cues for posture and R knee  flexion during swing phase; safe use of AD   Stairs         General stair comments: pt declined need to practice step again and able to verbalize understading of sequencing  Wheelchair Mobility    Modified Rankin (Stroke Patients Only)       Balance Overall balance assessment: Needs assistance Sitting-balance support: No upper extremity supported;Feet supported Sitting balance-Leahy Scale: Good     Standing balance support: Single extremity supported Standing balance-Leahy Scale: Fair Standing balance comment: able to stand with single UE support with static stand to donn house coat                    Cognition Arousal/Alertness: Awake/alert Behavior During Therapy: WFL for tasks assessed/performed Overall Cognitive Status: Within Functional Limits for tasks assessed                      Exercises Total Joint Exercises Quad Sets: AROM;Right;10 reps;Seated Heel Slides: AAROM;Right;10 reps;Seated Goniometric ROM: 5-70    General Comments        Pertinent Vitals/Pain Pain Assessment: 0-10 Pain Score: 7  Pain Location: R knee with mobility Pain Descriptors / Indicators: Sore Pain Intervention(s): Limited activity within patient's tolerance;Monitored during session;Premedicated before session;Repositioned;Ice applied    Home Living                      Prior Function            PT Goals (current goals can now be found in the care plan section) Acute Rehab PT Goals Patient Stated Goal: go home Progress towards PT  goals: Progressing toward goals    Frequency  7X/week    PT Plan Current plan remains appropriate    Co-evaluation             End of Session Equipment Utilized During Treatment: Gait belt Activity Tolerance: Patient tolerated treatment well Patient left: in bed;with call bell/phone within reach     Time: 0939-1007 PT Time Calculation (min) (ACUTE ONLY): 28 min  Charges:  $Gait Training: 8-22  mins $Therapeutic Exercise: 8-22 mins                    G Codes:      Salina April, PTA Pager: [phone]   04/22/2015, 10:17 AM

## 2015-04-22 NOTE — Progress Notes (Signed)
Subjective: 3 Days Post-Op Procedure(s) (LRB): RIGHT TOTAL KNEE ARTHROPLASTY (Right) Patient reports pain as mild and moderate.  Urinary Retention, still with foley.  Patient wanting to go home when able.   Objective: Vital signs in last 24 hours: Temp:  [99.3 F (37.4 C)-101.3 F (38.5 C)] 99.9 F (37.7 C) (04/10 0550) Pulse Rate:  [79-85] 85 (04/10 0550) Resp:  [16-18] 16 (04/10 0550) BP: (158-164)/(65-75) 164/73 mmHg (04/10 0550) SpO2:  [95 %-98 %] 98 % (04/10 0550)  Intake/Output from previous day: 04/09 0701 - 04/10 0700 In: 240 [P.O.:240] Out: 750 [Urine:750] Intake/Output this shift:     Recent Labs  04/20/15 0436 04/21/15 0624 04/22/15 0534  HGB 10.8* 9.5* 9.3*    Recent Labs  04/21/15 0624 04/22/15 0534  WBC 10.6* 10.5  RBC 3.09* 3.06*  HCT 28.0* 27.4*  PLT 209 204    Recent Labs  04/20/15 0436  NA 138  K 3.6  CL 105  CO2 21*  BUN 15  CREATININE 0.74  GLUCOSE 102*  CALCIUM 8.6*   No results for input(s): LABPT, INR in the last 72 hours.  Neurovascular intact Sensation intact distally Intact pulses distally Dorsiflexion/Plantar flexion intact Incision: dressing C/D/I No cellulitis present Compartment soft  Assessment/Plan: 3 Days Post-Op Procedure(s) (LRB): RIGHT TOTAL KNEE ARTHROPLASTY (Right)  Urinary Retention  Up with therapy Discharge home with home health once voiding today  Chriss Czar 04/22/2015, 8:54 AM

## 2015-04-23 ENCOUNTER — Encounter (HOSPITAL_COMMUNITY): Payer: Self-pay | Admitting: Orthopedic Surgery

## 2015-04-23 DIAGNOSIS — Z96651 Presence of right artificial knee joint: Secondary | ICD-10-CM | POA: Diagnosis not present

## 2015-04-23 DIAGNOSIS — Z471 Aftercare following joint replacement surgery: Secondary | ICD-10-CM | POA: Diagnosis not present

## 2015-04-23 DIAGNOSIS — Z96642 Presence of left artificial hip joint: Secondary | ICD-10-CM | POA: Diagnosis not present

## 2015-04-23 DIAGNOSIS — Z7901 Long term (current) use of anticoagulants: Secondary | ICD-10-CM | POA: Diagnosis not present

## 2015-04-23 DIAGNOSIS — Z96641 Presence of right artificial hip joint: Secondary | ICD-10-CM | POA: Diagnosis not present

## 2015-04-23 DIAGNOSIS — I10 Essential (primary) hypertension: Secondary | ICD-10-CM | POA: Diagnosis not present

## 2015-04-25 DIAGNOSIS — Z96651 Presence of right artificial knee joint: Secondary | ICD-10-CM | POA: Diagnosis not present

## 2015-04-25 DIAGNOSIS — Z96641 Presence of right artificial hip joint: Secondary | ICD-10-CM | POA: Diagnosis not present

## 2015-04-25 DIAGNOSIS — Z471 Aftercare following joint replacement surgery: Secondary | ICD-10-CM | POA: Diagnosis not present

## 2015-04-25 DIAGNOSIS — I10 Essential (primary) hypertension: Secondary | ICD-10-CM | POA: Diagnosis not present

## 2015-04-25 DIAGNOSIS — Z7901 Long term (current) use of anticoagulants: Secondary | ICD-10-CM | POA: Diagnosis not present

## 2015-04-25 DIAGNOSIS — Z96642 Presence of left artificial hip joint: Secondary | ICD-10-CM | POA: Diagnosis not present

## 2015-04-26 DIAGNOSIS — Z96642 Presence of left artificial hip joint: Secondary | ICD-10-CM | POA: Diagnosis not present

## 2015-04-26 DIAGNOSIS — I10 Essential (primary) hypertension: Secondary | ICD-10-CM | POA: Diagnosis not present

## 2015-04-26 DIAGNOSIS — Z96651 Presence of right artificial knee joint: Secondary | ICD-10-CM | POA: Diagnosis not present

## 2015-04-26 DIAGNOSIS — Z96641 Presence of right artificial hip joint: Secondary | ICD-10-CM | POA: Diagnosis not present

## 2015-04-26 DIAGNOSIS — Z471 Aftercare following joint replacement surgery: Secondary | ICD-10-CM | POA: Diagnosis not present

## 2015-04-26 DIAGNOSIS — Z7901 Long term (current) use of anticoagulants: Secondary | ICD-10-CM | POA: Diagnosis not present

## 2015-04-30 DIAGNOSIS — Z7901 Long term (current) use of anticoagulants: Secondary | ICD-10-CM | POA: Diagnosis not present

## 2015-04-30 DIAGNOSIS — Z471 Aftercare following joint replacement surgery: Secondary | ICD-10-CM | POA: Diagnosis not present

## 2015-04-30 DIAGNOSIS — Z96651 Presence of right artificial knee joint: Secondary | ICD-10-CM | POA: Diagnosis not present

## 2015-04-30 DIAGNOSIS — Z96641 Presence of right artificial hip joint: Secondary | ICD-10-CM | POA: Diagnosis not present

## 2015-04-30 DIAGNOSIS — I10 Essential (primary) hypertension: Secondary | ICD-10-CM | POA: Diagnosis not present

## 2015-04-30 DIAGNOSIS — Z96642 Presence of left artificial hip joint: Secondary | ICD-10-CM | POA: Diagnosis not present

## 2015-05-01 DIAGNOSIS — I10 Essential (primary) hypertension: Secondary | ICD-10-CM | POA: Diagnosis not present

## 2015-05-01 DIAGNOSIS — Z96642 Presence of left artificial hip joint: Secondary | ICD-10-CM | POA: Diagnosis not present

## 2015-05-01 DIAGNOSIS — Z7901 Long term (current) use of anticoagulants: Secondary | ICD-10-CM | POA: Diagnosis not present

## 2015-05-01 DIAGNOSIS — Z96651 Presence of right artificial knee joint: Secondary | ICD-10-CM | POA: Diagnosis not present

## 2015-05-01 DIAGNOSIS — Z96641 Presence of right artificial hip joint: Secondary | ICD-10-CM | POA: Diagnosis not present

## 2015-05-01 DIAGNOSIS — Z471 Aftercare following joint replacement surgery: Secondary | ICD-10-CM | POA: Diagnosis not present

## 2015-05-02 DIAGNOSIS — M1711 Unilateral primary osteoarthritis, right knee: Secondary | ICD-10-CM | POA: Diagnosis not present

## 2015-05-03 DIAGNOSIS — Z7901 Long term (current) use of anticoagulants: Secondary | ICD-10-CM | POA: Diagnosis not present

## 2015-05-03 DIAGNOSIS — Z471 Aftercare following joint replacement surgery: Secondary | ICD-10-CM | POA: Diagnosis not present

## 2015-05-03 DIAGNOSIS — I10 Essential (primary) hypertension: Secondary | ICD-10-CM | POA: Diagnosis not present

## 2015-05-03 DIAGNOSIS — Z96641 Presence of right artificial hip joint: Secondary | ICD-10-CM | POA: Diagnosis not present

## 2015-05-03 DIAGNOSIS — Z96642 Presence of left artificial hip joint: Secondary | ICD-10-CM | POA: Diagnosis not present

## 2015-05-03 DIAGNOSIS — Z96651 Presence of right artificial knee joint: Secondary | ICD-10-CM | POA: Diagnosis not present

## 2015-05-06 DIAGNOSIS — Z96651 Presence of right artificial knee joint: Secondary | ICD-10-CM | POA: Diagnosis not present

## 2015-05-06 DIAGNOSIS — Z96641 Presence of right artificial hip joint: Secondary | ICD-10-CM | POA: Diagnosis not present

## 2015-05-06 DIAGNOSIS — Z7901 Long term (current) use of anticoagulants: Secondary | ICD-10-CM | POA: Diagnosis not present

## 2015-05-06 DIAGNOSIS — Z471 Aftercare following joint replacement surgery: Secondary | ICD-10-CM | POA: Diagnosis not present

## 2015-05-06 DIAGNOSIS — I10 Essential (primary) hypertension: Secondary | ICD-10-CM | POA: Diagnosis not present

## 2015-05-06 DIAGNOSIS — Z96642 Presence of left artificial hip joint: Secondary | ICD-10-CM | POA: Diagnosis not present

## 2015-05-08 DIAGNOSIS — Z96642 Presence of left artificial hip joint: Secondary | ICD-10-CM | POA: Diagnosis not present

## 2015-05-08 DIAGNOSIS — Z96641 Presence of right artificial hip joint: Secondary | ICD-10-CM | POA: Diagnosis not present

## 2015-05-08 DIAGNOSIS — Z96651 Presence of right artificial knee joint: Secondary | ICD-10-CM | POA: Diagnosis not present

## 2015-05-08 DIAGNOSIS — Z7901 Long term (current) use of anticoagulants: Secondary | ICD-10-CM | POA: Diagnosis not present

## 2015-05-08 DIAGNOSIS — Z471 Aftercare following joint replacement surgery: Secondary | ICD-10-CM | POA: Diagnosis not present

## 2015-05-08 DIAGNOSIS — I10 Essential (primary) hypertension: Secondary | ICD-10-CM | POA: Diagnosis not present

## 2015-05-10 DIAGNOSIS — I10 Essential (primary) hypertension: Secondary | ICD-10-CM | POA: Diagnosis not present

## 2015-05-10 DIAGNOSIS — Z471 Aftercare following joint replacement surgery: Secondary | ICD-10-CM | POA: Diagnosis not present

## 2015-05-10 DIAGNOSIS — Z96642 Presence of left artificial hip joint: Secondary | ICD-10-CM | POA: Diagnosis not present

## 2015-05-10 DIAGNOSIS — Z96641 Presence of right artificial hip joint: Secondary | ICD-10-CM | POA: Diagnosis not present

## 2015-05-10 DIAGNOSIS — Z7901 Long term (current) use of anticoagulants: Secondary | ICD-10-CM | POA: Diagnosis not present

## 2015-05-10 DIAGNOSIS — Z96651 Presence of right artificial knee joint: Secondary | ICD-10-CM | POA: Diagnosis not present

## 2015-05-15 DIAGNOSIS — Z471 Aftercare following joint replacement surgery: Secondary | ICD-10-CM | POA: Diagnosis not present

## 2015-05-15 DIAGNOSIS — M25661 Stiffness of right knee, not elsewhere classified: Secondary | ICD-10-CM | POA: Diagnosis not present

## 2015-05-15 DIAGNOSIS — Z96651 Presence of right artificial knee joint: Secondary | ICD-10-CM | POA: Diagnosis not present

## 2015-05-15 DIAGNOSIS — M25561 Pain in right knee: Secondary | ICD-10-CM | POA: Diagnosis not present

## 2015-05-22 DIAGNOSIS — M25561 Pain in right knee: Secondary | ICD-10-CM | POA: Diagnosis not present

## 2015-05-22 DIAGNOSIS — M25661 Stiffness of right knee, not elsewhere classified: Secondary | ICD-10-CM | POA: Diagnosis not present

## 2015-05-22 DIAGNOSIS — Z471 Aftercare following joint replacement surgery: Secondary | ICD-10-CM | POA: Diagnosis not present

## 2015-05-22 DIAGNOSIS — Z96651 Presence of right artificial knee joint: Secondary | ICD-10-CM | POA: Diagnosis not present

## 2015-05-24 DIAGNOSIS — M25561 Pain in right knee: Secondary | ICD-10-CM | POA: Diagnosis not present

## 2015-05-24 DIAGNOSIS — M25661 Stiffness of right knee, not elsewhere classified: Secondary | ICD-10-CM | POA: Diagnosis not present

## 2015-05-24 DIAGNOSIS — Z471 Aftercare following joint replacement surgery: Secondary | ICD-10-CM | POA: Diagnosis not present

## 2015-05-24 DIAGNOSIS — Z96651 Presence of right artificial knee joint: Secondary | ICD-10-CM | POA: Diagnosis not present

## 2015-05-29 DIAGNOSIS — M25561 Pain in right knee: Secondary | ICD-10-CM | POA: Diagnosis not present

## 2015-05-29 DIAGNOSIS — M25661 Stiffness of right knee, not elsewhere classified: Secondary | ICD-10-CM | POA: Diagnosis not present

## 2015-05-29 DIAGNOSIS — Z471 Aftercare following joint replacement surgery: Secondary | ICD-10-CM | POA: Diagnosis not present

## 2015-05-29 DIAGNOSIS — Z96651 Presence of right artificial knee joint: Secondary | ICD-10-CM | POA: Diagnosis not present

## 2015-05-30 DIAGNOSIS — M25561 Pain in right knee: Secondary | ICD-10-CM | POA: Diagnosis not present

## 2015-05-31 DIAGNOSIS — M25661 Stiffness of right knee, not elsewhere classified: Secondary | ICD-10-CM | POA: Diagnosis not present

## 2015-05-31 DIAGNOSIS — M25561 Pain in right knee: Secondary | ICD-10-CM | POA: Diagnosis not present

## 2015-05-31 DIAGNOSIS — Z96651 Presence of right artificial knee joint: Secondary | ICD-10-CM | POA: Diagnosis not present

## 2015-05-31 DIAGNOSIS — Z471 Aftercare following joint replacement surgery: Secondary | ICD-10-CM | POA: Diagnosis not present

## 2015-06-05 DIAGNOSIS — M25661 Stiffness of right knee, not elsewhere classified: Secondary | ICD-10-CM | POA: Diagnosis not present

## 2015-06-05 DIAGNOSIS — Z96651 Presence of right artificial knee joint: Secondary | ICD-10-CM | POA: Diagnosis not present

## 2015-06-05 DIAGNOSIS — Z471 Aftercare following joint replacement surgery: Secondary | ICD-10-CM | POA: Diagnosis not present

## 2015-06-05 DIAGNOSIS — M25561 Pain in right knee: Secondary | ICD-10-CM | POA: Diagnosis not present

## 2015-06-07 DIAGNOSIS — M25561 Pain in right knee: Secondary | ICD-10-CM | POA: Diagnosis not present

## 2015-06-07 DIAGNOSIS — M25661 Stiffness of right knee, not elsewhere classified: Secondary | ICD-10-CM | POA: Diagnosis not present

## 2015-06-07 DIAGNOSIS — Z471 Aftercare following joint replacement surgery: Secondary | ICD-10-CM | POA: Diagnosis not present

## 2015-06-07 DIAGNOSIS — Z96651 Presence of right artificial knee joint: Secondary | ICD-10-CM | POA: Diagnosis not present

## 2015-06-12 DIAGNOSIS — Z96651 Presence of right artificial knee joint: Secondary | ICD-10-CM | POA: Diagnosis not present

## 2015-06-12 DIAGNOSIS — M25561 Pain in right knee: Secondary | ICD-10-CM | POA: Diagnosis not present

## 2015-06-12 DIAGNOSIS — Z471 Aftercare following joint replacement surgery: Secondary | ICD-10-CM | POA: Diagnosis not present

## 2015-06-12 DIAGNOSIS — M25661 Stiffness of right knee, not elsewhere classified: Secondary | ICD-10-CM | POA: Diagnosis not present

## 2015-06-14 DIAGNOSIS — M25561 Pain in right knee: Secondary | ICD-10-CM | POA: Diagnosis not present

## 2015-06-14 DIAGNOSIS — Z96651 Presence of right artificial knee joint: Secondary | ICD-10-CM | POA: Diagnosis not present

## 2015-06-14 DIAGNOSIS — Z471 Aftercare following joint replacement surgery: Secondary | ICD-10-CM | POA: Diagnosis not present

## 2015-06-14 DIAGNOSIS — M25661 Stiffness of right knee, not elsewhere classified: Secondary | ICD-10-CM | POA: Diagnosis not present

## 2015-06-19 DIAGNOSIS — Z471 Aftercare following joint replacement surgery: Secondary | ICD-10-CM | POA: Diagnosis not present

## 2015-06-19 DIAGNOSIS — M25661 Stiffness of right knee, not elsewhere classified: Secondary | ICD-10-CM | POA: Diagnosis not present

## 2015-06-19 DIAGNOSIS — Z96651 Presence of right artificial knee joint: Secondary | ICD-10-CM | POA: Diagnosis not present

## 2015-06-19 DIAGNOSIS — M25561 Pain in right knee: Secondary | ICD-10-CM | POA: Diagnosis not present

## 2015-06-21 DIAGNOSIS — Z96651 Presence of right artificial knee joint: Secondary | ICD-10-CM | POA: Diagnosis not present

## 2015-06-21 DIAGNOSIS — M25661 Stiffness of right knee, not elsewhere classified: Secondary | ICD-10-CM | POA: Diagnosis not present

## 2015-06-21 DIAGNOSIS — Z471 Aftercare following joint replacement surgery: Secondary | ICD-10-CM | POA: Diagnosis not present

## 2015-06-21 DIAGNOSIS — M25561 Pain in right knee: Secondary | ICD-10-CM | POA: Diagnosis not present

## 2015-06-26 DIAGNOSIS — Z96651 Presence of right artificial knee joint: Secondary | ICD-10-CM | POA: Diagnosis not present

## 2015-06-26 DIAGNOSIS — M25561 Pain in right knee: Secondary | ICD-10-CM | POA: Diagnosis not present

## 2015-06-26 DIAGNOSIS — Z471 Aftercare following joint replacement surgery: Secondary | ICD-10-CM | POA: Diagnosis not present

## 2015-06-26 DIAGNOSIS — M25661 Stiffness of right knee, not elsewhere classified: Secondary | ICD-10-CM | POA: Diagnosis not present

## 2015-06-28 DIAGNOSIS — M1711 Unilateral primary osteoarthritis, right knee: Secondary | ICD-10-CM | POA: Diagnosis not present

## 2015-06-28 DIAGNOSIS — Z96651 Presence of right artificial knee joint: Secondary | ICD-10-CM | POA: Diagnosis not present

## 2015-06-28 DIAGNOSIS — M25561 Pain in right knee: Secondary | ICD-10-CM | POA: Diagnosis not present

## 2015-06-28 DIAGNOSIS — M25661 Stiffness of right knee, not elsewhere classified: Secondary | ICD-10-CM | POA: Diagnosis not present

## 2015-07-03 DIAGNOSIS — M1711 Unilateral primary osteoarthritis, right knee: Secondary | ICD-10-CM | POA: Diagnosis not present

## 2015-07-03 DIAGNOSIS — M25561 Pain in right knee: Secondary | ICD-10-CM | POA: Diagnosis not present

## 2015-07-03 DIAGNOSIS — Z96651 Presence of right artificial knee joint: Secondary | ICD-10-CM | POA: Diagnosis not present

## 2015-07-03 DIAGNOSIS — M25661 Stiffness of right knee, not elsewhere classified: Secondary | ICD-10-CM | POA: Diagnosis not present

## 2015-07-10 DIAGNOSIS — Z96651 Presence of right artificial knee joint: Secondary | ICD-10-CM | POA: Diagnosis not present

## 2015-07-10 DIAGNOSIS — M25661 Stiffness of right knee, not elsewhere classified: Secondary | ICD-10-CM | POA: Diagnosis not present

## 2015-07-10 DIAGNOSIS — M25561 Pain in right knee: Secondary | ICD-10-CM | POA: Diagnosis not present

## 2015-07-10 DIAGNOSIS — Z471 Aftercare following joint replacement surgery: Secondary | ICD-10-CM | POA: Diagnosis not present

## 2015-07-11 DIAGNOSIS — M25561 Pain in right knee: Secondary | ICD-10-CM | POA: Diagnosis not present

## 2015-07-12 DIAGNOSIS — Z471 Aftercare following joint replacement surgery: Secondary | ICD-10-CM | POA: Diagnosis not present

## 2015-07-12 DIAGNOSIS — M25561 Pain in right knee: Secondary | ICD-10-CM | POA: Diagnosis not present

## 2015-07-12 DIAGNOSIS — M25661 Stiffness of right knee, not elsewhere classified: Secondary | ICD-10-CM | POA: Diagnosis not present

## 2015-07-12 DIAGNOSIS — Z96651 Presence of right artificial knee joint: Secondary | ICD-10-CM | POA: Diagnosis not present

## 2015-07-17 DIAGNOSIS — M25561 Pain in right knee: Secondary | ICD-10-CM | POA: Diagnosis not present

## 2015-07-17 DIAGNOSIS — M25661 Stiffness of right knee, not elsewhere classified: Secondary | ICD-10-CM | POA: Diagnosis not present

## 2015-07-17 DIAGNOSIS — Z96651 Presence of right artificial knee joint: Secondary | ICD-10-CM | POA: Diagnosis not present

## 2015-07-17 DIAGNOSIS — Z471 Aftercare following joint replacement surgery: Secondary | ICD-10-CM | POA: Diagnosis not present

## 2015-07-23 DIAGNOSIS — Z79899 Other long term (current) drug therapy: Secondary | ICD-10-CM | POA: Diagnosis not present

## 2015-07-23 DIAGNOSIS — I10 Essential (primary) hypertension: Secondary | ICD-10-CM | POA: Diagnosis not present

## 2015-07-23 DIAGNOSIS — E785 Hyperlipidemia, unspecified: Secondary | ICD-10-CM | POA: Diagnosis not present

## 2015-07-23 DIAGNOSIS — Z136 Encounter for screening for cardiovascular disorders: Secondary | ICD-10-CM | POA: Diagnosis not present

## 2015-07-25 ENCOUNTER — Other Ambulatory Visit: Payer: Self-pay | Admitting: Family Medicine

## 2015-07-25 DIAGNOSIS — Z1231 Encounter for screening mammogram for malignant neoplasm of breast: Secondary | ICD-10-CM

## 2015-07-25 DIAGNOSIS — Z23 Encounter for immunization: Secondary | ICD-10-CM | POA: Diagnosis not present

## 2015-07-25 DIAGNOSIS — Z0000 Encounter for general adult medical examination without abnormal findings: Secondary | ICD-10-CM | POA: Diagnosis not present

## 2015-07-25 DIAGNOSIS — Z136 Encounter for screening for cardiovascular disorders: Secondary | ICD-10-CM | POA: Diagnosis not present

## 2015-07-25 DIAGNOSIS — M8580 Other specified disorders of bone density and structure, unspecified site: Secondary | ICD-10-CM

## 2015-07-25 DIAGNOSIS — I10 Essential (primary) hypertension: Secondary | ICD-10-CM | POA: Diagnosis not present

## 2015-07-26 DIAGNOSIS — M25661 Stiffness of right knee, not elsewhere classified: Secondary | ICD-10-CM | POA: Diagnosis not present

## 2015-07-26 DIAGNOSIS — Z96651 Presence of right artificial knee joint: Secondary | ICD-10-CM | POA: Diagnosis not present

## 2015-07-26 DIAGNOSIS — Z471 Aftercare following joint replacement surgery: Secondary | ICD-10-CM | POA: Diagnosis not present

## 2015-07-26 DIAGNOSIS — M25561 Pain in right knee: Secondary | ICD-10-CM | POA: Diagnosis not present

## 2015-08-02 DIAGNOSIS — Z96651 Presence of right artificial knee joint: Secondary | ICD-10-CM | POA: Diagnosis not present

## 2015-08-02 DIAGNOSIS — Z471 Aftercare following joint replacement surgery: Secondary | ICD-10-CM | POA: Diagnosis not present

## 2015-08-02 DIAGNOSIS — M25561 Pain in right knee: Secondary | ICD-10-CM | POA: Diagnosis not present

## 2015-08-02 DIAGNOSIS — M25661 Stiffness of right knee, not elsewhere classified: Secondary | ICD-10-CM | POA: Diagnosis not present

## 2015-08-06 ENCOUNTER — Other Ambulatory Visit: Payer: Commercial Managed Care - HMO

## 2015-08-06 ENCOUNTER — Ambulatory Visit: Payer: Commercial Managed Care - HMO

## 2015-08-09 DIAGNOSIS — M25561 Pain in right knee: Secondary | ICD-10-CM | POA: Diagnosis not present

## 2015-08-09 DIAGNOSIS — Z96651 Presence of right artificial knee joint: Secondary | ICD-10-CM | POA: Diagnosis not present

## 2015-08-09 DIAGNOSIS — M25661 Stiffness of right knee, not elsewhere classified: Secondary | ICD-10-CM | POA: Diagnosis not present

## 2015-08-09 DIAGNOSIS — M1711 Unilateral primary osteoarthritis, right knee: Secondary | ICD-10-CM | POA: Diagnosis not present

## 2015-08-12 ENCOUNTER — Ambulatory Visit
Admission: RE | Admit: 2015-08-12 | Discharge: 2015-08-12 | Disposition: A | Discharge status: Home / Self Care | Payer: Commercial Managed Care - HMO | Source: Ambulatory Visit | Attending: Family Medicine | Admitting: Family Medicine

## 2015-08-12 DIAGNOSIS — Z1231 Encounter for screening mammogram for malignant neoplasm of breast: Secondary | ICD-10-CM | POA: Diagnosis not present

## 2015-08-12 DIAGNOSIS — M8589 Other specified disorders of bone density and structure, multiple sites: Secondary | ICD-10-CM | POA: Diagnosis not present

## 2015-08-12 DIAGNOSIS — M8580 Other specified disorders of bone density and structure, unspecified site: Secondary | ICD-10-CM

## 2015-08-12 DIAGNOSIS — Z780 Asymptomatic menopausal state: Secondary | ICD-10-CM | POA: Diagnosis not present

## 2015-09-19 DIAGNOSIS — M8580 Other specified disorders of bone density and structure, unspecified site: Secondary | ICD-10-CM | POA: Diagnosis not present

## 2015-09-30 DIAGNOSIS — M8580 Other specified disorders of bone density and structure, unspecified site: Secondary | ICD-10-CM | POA: Diagnosis not present

## 2015-09-30 DIAGNOSIS — E559 Vitamin D deficiency, unspecified: Secondary | ICD-10-CM | POA: Diagnosis not present

## 2015-09-30 DIAGNOSIS — M1990 Unspecified osteoarthritis, unspecified site: Secondary | ICD-10-CM | POA: Diagnosis not present

## 2015-09-30 DIAGNOSIS — Z23 Encounter for immunization: Secondary | ICD-10-CM | POA: Diagnosis not present

## 2015-09-30 DIAGNOSIS — E739 Lactose intolerance, unspecified: Secondary | ICD-10-CM | POA: Diagnosis not present

## 2015-12-31 DIAGNOSIS — Z136 Encounter for screening for cardiovascular disorders: Secondary | ICD-10-CM | POA: Diagnosis not present

## 2016-01-01 DIAGNOSIS — Z0100 Encounter for examination of eyes and vision without abnormal findings: Secondary | ICD-10-CM | POA: Diagnosis not present

## 2016-01-02 DIAGNOSIS — Z6829 Body mass index (BMI) 29.0-29.9, adult: Secondary | ICD-10-CM | POA: Diagnosis not present

## 2016-01-02 DIAGNOSIS — I10 Essential (primary) hypertension: Secondary | ICD-10-CM | POA: Diagnosis not present

## 2016-01-02 DIAGNOSIS — E559 Vitamin D deficiency, unspecified: Secondary | ICD-10-CM | POA: Diagnosis not present

## 2016-01-02 DIAGNOSIS — Z136 Encounter for screening for cardiovascular disorders: Secondary | ICD-10-CM | POA: Diagnosis not present

## 2016-08-03 ENCOUNTER — Other Ambulatory Visit: Payer: Self-pay | Admitting: Family Medicine

## 2016-08-03 DIAGNOSIS — Z1231 Encounter for screening mammogram for malignant neoplasm of breast: Secondary | ICD-10-CM

## 2016-08-11 ENCOUNTER — Ambulatory Visit
Admission: RE | Admit: 2016-08-11 | Discharge: 2016-08-11 | Disposition: A | Discharge status: Home / Self Care | Payer: Medicare PPO | Source: Ambulatory Visit | Attending: Family Medicine | Admitting: Family Medicine

## 2016-08-11 ENCOUNTER — Other Ambulatory Visit: Payer: Self-pay | Admitting: Family Medicine

## 2016-08-11 DIAGNOSIS — R2232 Localized swelling, mass and lump, left upper limb: Secondary | ICD-10-CM

## 2016-08-12 ENCOUNTER — Ambulatory Visit
Admission: RE | Admit: 2016-08-12 | Discharge: 2016-08-12 | Disposition: A | Discharge status: Home / Self Care | Payer: Medicare PPO | Source: Ambulatory Visit | Attending: Family Medicine | Admitting: Family Medicine

## 2016-08-12 DIAGNOSIS — Z1231 Encounter for screening mammogram for malignant neoplasm of breast: Secondary | ICD-10-CM

## 2017-01-31 ENCOUNTER — Ambulatory Visit (HOSPITAL_COMMUNITY)
Admission: EM | Admit: 2017-01-31 | Discharge: 2017-01-31 | Disposition: A | Discharge status: Home / Self Care | Payer: Medicare HMO | Attending: Internal Medicine | Admitting: Internal Medicine

## 2017-01-31 ENCOUNTER — Encounter (HOSPITAL_COMMUNITY): Payer: Self-pay | Admitting: Emergency Medicine

## 2017-01-31 ENCOUNTER — Other Ambulatory Visit: Payer: Self-pay

## 2017-01-31 DIAGNOSIS — R059 Cough, unspecified: Secondary | ICD-10-CM

## 2017-01-31 DIAGNOSIS — R05 Cough: Secondary | ICD-10-CM

## 2017-01-31 MED ORDER — GUAIFENESIN-DM 100-10 MG/5ML PO SYRP: 5.0000 mL | ORAL_SOLUTION | ORAL | 0 refills | Status: AC | PRN

## 2017-01-31 MED ORDER — ALBUTEROL SULFATE HFA 108 (90 BASE) MCG/ACT IN AERS: 1.0000 | INHALATION_SPRAY | Freq: Four times a day (QID) | RESPIRATORY_TRACT | 0 refills | Status: AC | PRN

## 2017-01-31 MED ORDER — HYDROCODONE-HOMATROPINE 5-1.5 MG/5ML PO SYRP: 5.0000 mL | ORAL_SOLUTION | Freq: Four times a day (QID) | ORAL | 0 refills | Status: DC | PRN

## 2017-01-31 MED ORDER — IPRATROPIUM-ALBUTEROL 0.5-2.5 (3) MG/3ML IN SOLN
3.0000 mL | Freq: Once | RESPIRATORY_TRACT | Status: AC
  Administered 2017-01-31: 3 mL via RESPIRATORY_TRACT

## 2017-01-31 MED ORDER — IPRATROPIUM-ALBUTEROL 0.5-2.5 (3) MG/3ML IN SOLN
RESPIRATORY_TRACT | Status: AC
  Filled 2017-01-31: qty 3

## 2017-01-31 NOTE — ED Provider Notes (Signed)
[place]    CSN: [id number] Arrival date & time: 01/31/17  1211     History   Chief Complaint Chief Complaint  Patient presents with  . Cough    HPI Rhonda Hart is a 72 y.o. female presenting with cough and congestion x 1 week.  Patient's main complaints are cough while sleeping. Symptoms have been going on for 1 week. Patient has tried corticeden and delsym, with minimal relief. Denies fever, nausea, vomiting, diarrhea. Denies shortness of breath and chest pain. Does endorse a smoking history- 5-10 cigarettes a day, does not know for how long. Denies history of asthma/copd.    HPI  Past Medical History:  Diagnosis Date  . Arthritis   . History of colon polyps    benign  . Hyperlipidemia    has never been on meds  . Hypertension    takes Hyzaar,HCTZ,and Metoprolol daily  . Joint pain     Patient Active Problem List   Diagnosis Date Noted  . UTI (urinary tract infection) 04/21/2015  . Primary localized osteoarthritis of right knee 04/19/2015  . Hypertension   . Hyperlipidemia   . 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 ABDOMINAL HYSTERECTOMY    . CESAREAN SECTION    . colonosocpy    . JOINT REPLACEMENT    . TOTAL HIP ARTHROPLASTY     x 2 on right and x 1 on left  . TOTAL HIP REVISION Left 11/30/2014   Procedure: TOTAL LEFT HIP REVISION;  Surgeon: Earlie Server, MD;  Location: [facility];  Service: Orthopedics;  Laterality: Left;  . TOTAL KNEE ARTHROPLASTY Right 04/19/2015   Procedure: RIGHT TOTAL KNEE ARTHROPLASTY;  Surgeon: Earlie Server, MD;  Location: [facility];  Service: Orthopedics;  Laterality: Right;    OB History    No data available       Home Medications    Prior to Admission medications   Medication Sig Start Date End Date Taking? Authorizing Provider  aspirin 81 MG tablet Take 81 mg by mouth daily.   Yes [provider]  albuterol (PROVENTIL HFA;VENTOLIN HFA) 108 (90 Base) MCG/ACT inhaler Inhale 1-2 puffs into the lungs every 6 (six) hours as needed for up to 7 days for wheezing or shortness of breath. 01/31/17 02/07/17  Katianne Barre C, PA-C  amLODipine (NORVASC) 2.5 MG tablet Take 2.5 mg by mouth daily.    [provider]  guaiFENesin-dextromethorphan (ROBITUSSIN DM) 100-10 MG/5ML syrup Take 5 mLs by mouth every 4 (four) hours as needed for up to 5 days for cough. 01/31/17 02/05/17  Karlei Waldo C, PA-C  HYDROcodone-homatropine (HYCODAN) 5-1.5 MG/5ML syrup Take 5 mLs by mouth every 6 (six) hours as needed for cough. Please use in the evening/near bedtime 01/31/17   Janiesha Diehl, [employer] C, PA-C  losartan-hydrochlorothiazide (HYZAAR) 100-25 MG tablet Take 1 tablet by mouth daily. 02/05/15   [provider]  metoprolol succinate (TOPROL-XL) 50 MG 24 hr tablet Take 50 mg by mouth daily. 03/10/13   [provider]    Family History No family history on file.  Social History Social History   Tobacco Use  . Smoking status: Never Smoker  Substance Use Topics  . Alcohol use: No  . Drug use: No     Allergies   Coumadin [warfarin sodium]   Review of Systems Review of Systems  Constitutional: Negative for  chills, fatigue and fever.  HENT: Positive for congestion and rhinorrhea. Negative for ear pain, sinus pressure, sore throat and trouble swallowing.   Respiratory: Positive for cough. Negative for chest tightness and shortness of breath.   Cardiovascular: Negative for chest pain.  Gastrointestinal: Negative for abdominal pain, nausea and vomiting.  Musculoskeletal: Negative for myalgias.  Skin: Negative for rash.  Neurological: Negative for dizziness, light-headedness and headaches.     Physical Exam Triage Vital Signs ED Triage Vitals [01/31/17 1232]  Enc Vitals Group     BP (!) 155/78     Pulse Rate 74     Resp 16     Temp 97.7 F (36.5 C)     Temp Source  Oral     SpO2 98 %     Weight      Height      Head Circumference      Peak Flow      Pain Score      Pain Loc      Pain Edu?      Excl. in [place]?    No data found.  Updated Vital Signs BP (!) 155/78 (BP Location: Left Arm) Comment: Nurse Kenton Kingfisher aware of the BP  Pulse 74   Temp 97.7 F (36.5 C) (Oral)   Resp 16   SpO2 98%   Visual Acuity Right Eye Distance:   Left Eye Distance:   Bilateral Distance:    Right Eye Near:   Left Eye Near:    Bilateral Near:     Physical Exam  Constitutional: She is oriented to person, place, and time. She appears well-developed and well-nourished. No distress.  HENT:  Head: Normocephalic and atraumatic.  Right Ear: Ear canal normal.  Left Ear: Tympanic membrane and ear canal normal.  Nose: Rhinorrhea present.  Mouth/Throat: Uvula is midline, oropharynx is clear and moist and mucous membranes are normal. No oral lesions. No trismus in the jaw. No uvula swelling. No posterior oropharyngeal erythema. No tonsillar exudate.  Cerumen blocking right TM Erythematous turbinates  Eyes: Conjunctivae are normal.  Neck: Neck supple.  Cardiovascular: Normal rate and regular rhythm.  No murmur heard. Pulmonary/Chest: Effort normal. No respiratory distress. She has wheezes.  Diffuse wheezing and rhonchi throughout lungs bilaterally, rhonchi improved with treatment, mild wheezing remained.  Abdominal: Soft. There is no tenderness.  Musculoskeletal: She exhibits no edema.  Neurological: She is alert and oriented to person, place, and time.  Skin: Skin is warm and dry.  Psychiatric: She has a normal mood and affect.  Nursing note and vitals reviewed.    UC Treatments / Results  Labs (all labs ordered are listed, but only abnormal results are displayed) Labs Reviewed - No data to display  EKG  EKG Interpretation None       Radiology No results found.  Procedures Procedures (including critical care time)  Medications Ordered in  UC Medications  ipratropium-albuterol (DUONEB) 0.5-2.5 (3) MG/3ML nebulizer solution 3 mL (3 mLs Nebulization Given 01/31/17 1325)     Initial Impression / Assessment and Plan / UC Course  I have reviewed the triage vital signs and the nursing notes.  Pertinent labs & imaging results that were available during my care of the patient were reviewed by me and considered in my medical decision making (see chart for details).     Patient presents with symptoms likely from a viral upper respiratory infection vs acute bronchitis vs. cough complicated from underlying COPD.Do not suspect underlying cardiopulmonary process. Symptoms  seem unlikely related to ACS, CHF or COPD exacerbations, pneumonia, pneumothorax. Patient is nontoxic appearing and not in need of emergent medical intervention.  Breathing treatment with improved breathing. Hycodan for cough at night, advised to not drive and try to only use at night as it causes sedation. Robitussin DM during the day. Albuterol inhaler for wheezing/shortness of breath.  Return if symptoms fail to improve in 1-2 weeks or you develop shortness of breath, chest pain, severe headache. Patient states understanding and is agreeable.  Final Clinical Impressions(s) / UC Diagnoses   Final diagnoses:  Cough    ED Discharge Orders        Ordered    HYDROcodone-homatropine (HYCODAN) 5-1.5 MG/5ML syrup  Every 6 hours PRN     01/31/17 1340    albuterol (PROVENTIL HFA;VENTOLIN HFA) 108 (90 Base) MCG/ACT inhaler  Every 6 hours PRN     01/31/17 1340    guaiFENesin-dextromethorphan (ROBITUSSIN DM) 100-10 MG/5ML syrup  Every 4 hours PRN     01/31/17 1340       Controlled Substance Prescriptions [place] Controlled Substance Registry consulted? Yes, I have consulted the [place] Controlled Substances Registry for this patient, and feel the risk/benefit ratio today is favorable for proceeding with this prescription for a controlled substance. No prescription in 2018.     Janith Lima, PA-C 01/31/17 1348

## 2017-01-31 NOTE — ED Triage Notes (Signed)
Pt c/o cough x1 week, coughing up mucous.

## 2017-01-31 NOTE — Discharge Instructions (Signed)
For your cough please use robitussin DM during the day. In the evening you may take Hycodan syrup, this will cause sedation so please no driving and only use in the evening to help with cough while sleeping.   Use inhaler as needed for wheezing and shortness of breath.   Please return if symptoms worsening or develop increased shortness of breath or difficulty breathing, fevers. Cough may linger as you may have underlying lung disease from smoking.

## 2017-02-18 IMAGING — NM NM BONE 3 PHASE
10 series · 20 of 20 positions shown · non-contrast
Comparison: None

CLINICAL DATA: Bilateral hip arthroplasty. Pain and left hip for
the past 2 months.

EXAM:
NUCLEAR MEDICINE 3-PHASE BONE SCAN
TECHNIQUE: Radionuclide angiographic images, immediate static blood pool
images, and 3-hour delayed static images were obtained of the pelvis
after intravenous injection of radiopharmaceutical.
RADIOPHARMACEUTICALS:  25.0 mCi Dc-UUm MDP

[Series 1: flow · 2.07mm/px · 6 of 48 frames shown (1 of 2)]
[frame 5/48  full-range]
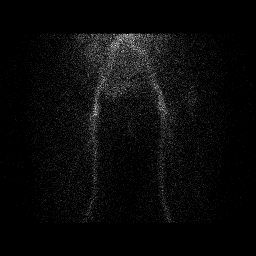
[frame 13/48  full-range]
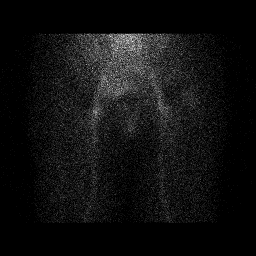
[frame 21/48  full-range]
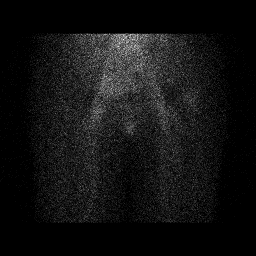
[frame 29/48  full-range]
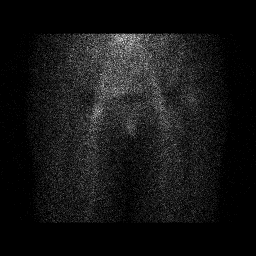
[frame 37/48  full-range]
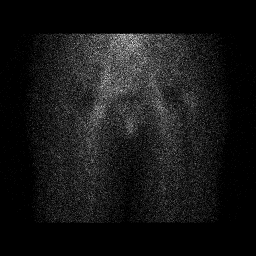
[frame 45/48  full-range]
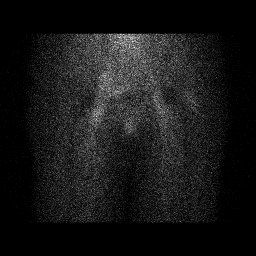

[Series 1: flow · 2.07mm/px · 6 of 48 frames shown (2 of 2)]
[frame 5/48  full-range]
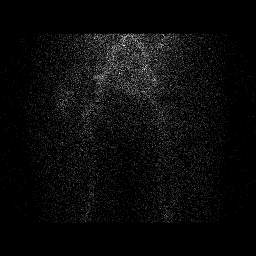
[frame 13/48  full-range]
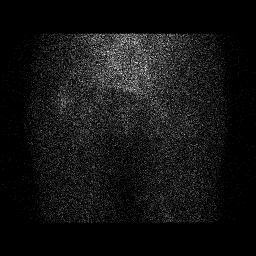
[frame 21/48  full-range]
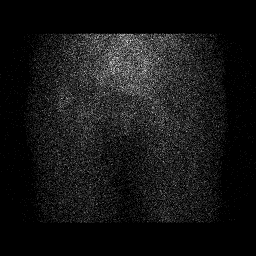
[frame 29/48  full-range]
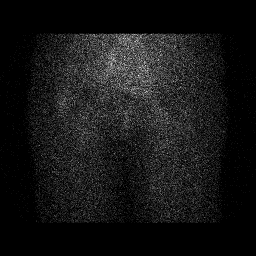
[frame 37/48  full-range]
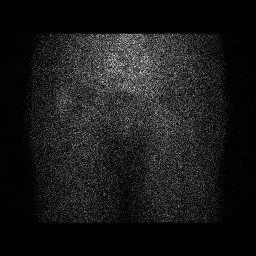
[frame 45/48  full-range]
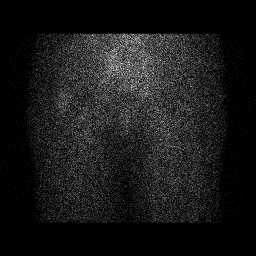

[Series 2: blood pool · 2.07mm/px · 1 of 1 slices shown (1 of 4)]
[im 1/1]
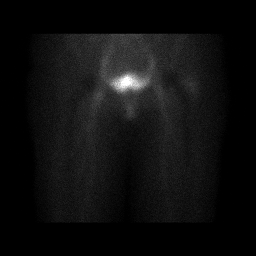

[Series 2: blood pool · 2.07mm/px · 1 of 1 slices shown (2 of 4)]
[im 1/1]
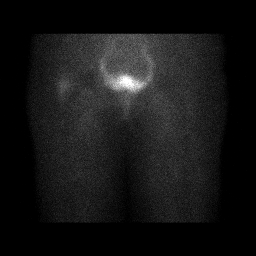

[Series 3: lat bp · 2.07mm/px · 1 of 1 slices shown (1 of 2)]
[im 1/1]
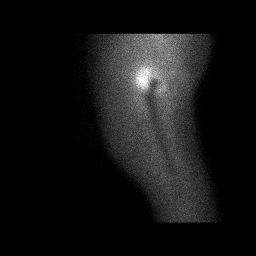

[Series 3: lat bp · 2.07mm/px · 1 of 1 slices shown (2 of 2)]
[im 1/1]
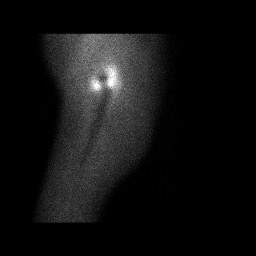

[Series 5: blood pool · 2.07mm/px · 1 of 1 slices shown (3 of 4)]
[im 1/1]
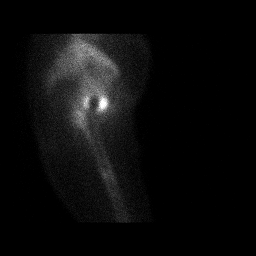

[Series 5: blood pool · 2.07mm/px · 1 of 1 slices shown (4 of 4)]
[im 1/1]
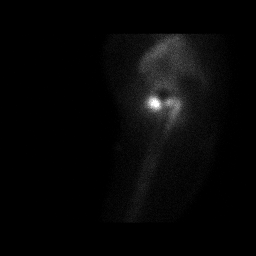

[Series 6: bone statics · 2.07mm/px · 1 of 1 slices shown (1 of 2)]
[im 1/1]
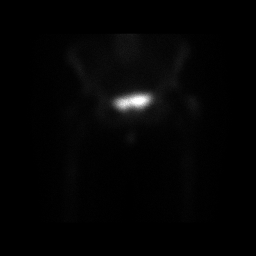

[Series 6: bone statics · 2.07mm/px · 1 of 1 slices shown (2 of 2)]
[im 1/1]
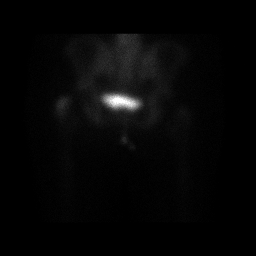

[20 of 20 positions shown; findings below may reference images not displayed]

FINDINGS: Vascular phase: There is mild asymmetric increased flow to the
greater trochanter of the proximal left femur.

Blood pool phase: Asymmetric increased uptake localizes to the
greater trochanter of the proximal left femur.

Delayed phase: Increased uptake localizing to the greater trochanter
of the proximal left femur noted. There is mild heterogeneous uptake
surrounding the distal aspect of the femoral components of the
arthroplasty devices bilaterally.
IMPRESSION: Increased uptake on all 3 phases localizes to the greater trochanter
of the proximal left femur. Underlying loosening, infection or
fracture cannot be excluded. Correlation with plain film radiographs
recommended.

## 2017-05-14 DIAGNOSIS — I10 Essential (primary) hypertension: Secondary | ICD-10-CM | POA: Diagnosis not present

## 2017-05-14 DIAGNOSIS — F331 Major depressive disorder, recurrent, moderate: Secondary | ICD-10-CM | POA: Diagnosis not present

## 2017-05-14 DIAGNOSIS — Z6829 Body mass index (BMI) 29.0-29.9, adult: Secondary | ICD-10-CM | POA: Diagnosis not present

## 2017-05-14 DIAGNOSIS — E559 Vitamin D deficiency, unspecified: Secondary | ICD-10-CM | POA: Diagnosis not present

## 2017-08-05 DIAGNOSIS — Z1211 Encounter for screening for malignant neoplasm of colon: Secondary | ICD-10-CM | POA: Diagnosis not present

## 2017-08-05 DIAGNOSIS — F331 Major depressive disorder, recurrent, moderate: Secondary | ICD-10-CM | POA: Diagnosis not present

## 2017-08-05 DIAGNOSIS — Z23 Encounter for immunization: Secondary | ICD-10-CM | POA: Diagnosis not present

## 2017-08-05 DIAGNOSIS — Z6830 Body mass index (BMI) 30.0-30.9, adult: Secondary | ICD-10-CM | POA: Diagnosis not present

## 2017-08-05 DIAGNOSIS — Z0000 Encounter for general adult medical examination without abnormal findings: Secondary | ICD-10-CM | POA: Diagnosis not present

## 2017-09-03 DIAGNOSIS — E559 Vitamin D deficiency, unspecified: Secondary | ICD-10-CM | POA: Diagnosis not present

## 2017-09-03 DIAGNOSIS — E782 Mixed hyperlipidemia: Secondary | ICD-10-CM | POA: Diagnosis not present

## 2017-09-03 DIAGNOSIS — I10 Essential (primary) hypertension: Secondary | ICD-10-CM | POA: Diagnosis not present

## 2017-09-03 DIAGNOSIS — E870 Hyperosmolality and hypernatremia: Secondary | ICD-10-CM | POA: Diagnosis not present

## 2017-09-03 DIAGNOSIS — Z79899 Other long term (current) drug therapy: Secondary | ICD-10-CM | POA: Diagnosis not present

## 2017-09-08 DIAGNOSIS — Z6829 Body mass index (BMI) 29.0-29.9, adult: Secondary | ICD-10-CM | POA: Diagnosis not present

## 2017-09-08 DIAGNOSIS — E559 Vitamin D deficiency, unspecified: Secondary | ICD-10-CM | POA: Diagnosis not present

## 2017-09-08 DIAGNOSIS — Z23 Encounter for immunization: Secondary | ICD-10-CM | POA: Diagnosis not present

## 2017-09-08 DIAGNOSIS — I10 Essential (primary) hypertension: Secondary | ICD-10-CM | POA: Diagnosis not present

## 2017-09-08 DIAGNOSIS — E782 Mixed hyperlipidemia: Secondary | ICD-10-CM | POA: Diagnosis not present

## 2017-09-10 DIAGNOSIS — I10 Essential (primary) hypertension: Secondary | ICD-10-CM | POA: Diagnosis not present

## 2017-09-10 DIAGNOSIS — Z6830 Body mass index (BMI) 30.0-30.9, adult: Secondary | ICD-10-CM | POA: Diagnosis not present

## 2017-09-30 DIAGNOSIS — I10 Essential (primary) hypertension: Secondary | ICD-10-CM | POA: Diagnosis not present

## 2017-09-30 DIAGNOSIS — Z6830 Body mass index (BMI) 30.0-30.9, adult: Secondary | ICD-10-CM | POA: Diagnosis not present

## 2017-10-06 DIAGNOSIS — H524 Presbyopia: Secondary | ICD-10-CM | POA: Diagnosis not present

## 2017-10-06 DIAGNOSIS — H5203 Hypermetropia, bilateral: Secondary | ICD-10-CM | POA: Diagnosis not present

## 2017-10-06 DIAGNOSIS — H52209 Unspecified astigmatism, unspecified eye: Secondary | ICD-10-CM | POA: Diagnosis not present

## 2017-10-07 ENCOUNTER — Other Ambulatory Visit: Payer: Self-pay | Admitting: Family Medicine

## 2017-10-07 DIAGNOSIS — Z1231 Encounter for screening mammogram for malignant neoplasm of breast: Secondary | ICD-10-CM

## 2017-11-03 ENCOUNTER — Ambulatory Visit (HOSPITAL_COMMUNITY)
Admission: EM | Admit: 2017-11-03 | Discharge: 2017-11-03 | Disposition: A | Discharge status: Home / Self Care | Payer: Medicare HMO | Attending: Family Medicine | Admitting: Family Medicine

## 2017-11-03 ENCOUNTER — Encounter (HOSPITAL_COMMUNITY): Payer: Self-pay

## 2017-11-03 ENCOUNTER — Other Ambulatory Visit: Payer: Self-pay

## 2017-11-03 DIAGNOSIS — M5010 Cervical disc disorder with radiculopathy, unspecified cervical region: Secondary | ICD-10-CM

## 2017-11-03 MED ORDER — PREDNISONE 50 MG PO TABS: ORAL_TABLET | ORAL | 0 refills | Status: DC

## 2017-11-03 NOTE — Discharge Instructions (Signed)
See Dr. Ernie Hew if your pain is not improved by Monday.  If she cannot work nightly, you can come here.

## 2017-11-03 NOTE — ED Triage Notes (Signed)
Pt states she has right forearm pain radiating  up her arm into her shoulder. X 2 weeks

## 2017-11-03 NOTE — ED Provider Notes (Signed)
[place]    CSN: [id number] Arrival date & time: 11/03/17  1401     History   Chief Complaint Chief Complaint  Patient presents with  . aching in her forearm (Rt)    HPI Rhonda Hart is a 72 y.o. female.   This is a second [place] urgent care visit for this 72 year old female, now complaining of aching in her right forearm, shoulder, and supraclavicular area.  It is made worse by turning her head to the left.  She notices it several times a day with sharp pain followed by tremor in her hand.  She noticed some numbness in her hand but none in her fingertips.  She is noticed no loss of power in her right upper extremity.  She is right-handed.  She does smoke.  Patient's had no cough or hemoptysis.  She is noticed no swelling in her neck.  She has had no recent trauma to her neck.     Past Medical History:  Diagnosis Date  . Arthritis   . History of colon polyps    benign  . Hyperlipidemia    has never been on meds  . Hypertension    takes Hyzaar,HCTZ,and Metoprolol daily  . Joint pain     Patient Active Problem List   Diagnosis Date Noted  . UTI (urinary tract infection) 04/21/2015  . Primary localized osteoarthritis of right knee 04/19/2015  . Hypertension   . Hyperlipidemia   . 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 ABDOMINAL HYSTERECTOMY    . CESAREAN SECTION    . colonosocpy    . JOINT REPLACEMENT    . TOTAL HIP ARTHROPLASTY     x 2 on right and x 1 on left  . TOTAL HIP REVISION Left 11/30/2014   Procedure: TOTAL LEFT HIP REVISION;  Surgeon: Earlie Server, MD;  Location: [facility];  Service: Orthopedics;  Laterality: Left;  . TOTAL KNEE ARTHROPLASTY Right 04/19/2015   Procedure: RIGHT TOTAL KNEE ARTHROPLASTY;  Surgeon: Earlie Server, MD;  Location: [facility];  Service: Orthopedics;  Laterality: Right;     OB History   None      Home Medications    Prior to Admission medications   Medication Sig Start Date End Date Taking? Authorizing Provider  albuterol (PROVENTIL HFA;VENTOLIN HFA) 108 (90 Base) MCG/ACT inhaler Inhale 1-2 puffs into the lungs every 6 (six) hours as needed for up to 7 days for wheezing or shortness of breath. 01/31/17 02/07/17  Wieters, Hallie C, PA-C  amLODipine (NORVASC) 2.5 MG tablet Take 2.5 mg by mouth daily.    [provider]  aspirin 81 MG tablet Take 81 mg by mouth daily.    [provider]  HYDROcodone-homatropine (HYCODAN) 5-1.5 MG/5ML syrup Take 5 mLs by mouth every 6 (six) hours as needed for cough. Please use in the evening/near bedtime 01/31/17   Wieters, [employer] C, PA-C  losartan-hydrochlorothiazide (HYZAAR) 100-25 MG tablet Take 1 tablet by mouth daily. 02/05/15   [provider]  metoprolol succinate (TOPROL-XL) 50 MG 24 hr tablet Take 50 mg by mouth daily. 03/10/13   [provider]    Family History History reviewed. No pertinent family history.  Social History Social History   Tobacco Use  . Smoking status: Never Smoker  . Smokeless tobacco: Never Used  Substance Use Topics  . Alcohol  use: No  . Drug use: No     Allergies   Coumadin [warfarin sodium]   Review of Systems Review of Systems  Constitutional: Positive for chills.  Musculoskeletal: Positive for neck pain.  Neurological: Positive for tremors and numbness.  All other systems reviewed and are negative.    Physical Exam Triage Vital Signs ED Triage Vitals  Enc Vitals Group     BP 11/03/17 1421 (!) 168/73     Pulse Rate 11/03/17 1421 60     Resp 11/03/17 1421 16     Temp 11/03/17 1421 97.9 F (36.6 C)     Temp Source 11/03/17 1421 Oral     SpO2 11/03/17 1421 100 %     Weight 11/03/17 1422 240 lb (108.9 kg)     Height --      Head Circumference --      Peak Flow --      Pain Score 11/03/17 1422 8     Pain Loc --      Pain Edu?  --      Excl. in [place]? --    No data found.  Updated Vital Signs BP (!) 168/73 (BP Location: Right Arm)   Pulse 60   Temp 97.9 F (36.6 C) (Oral)   Resp 16   Wt 108.9 kg   SpO2 100%   BMI 45.35 kg/m    Physical Exam  Constitutional: She is oriented to person, place, and time. She appears well-developed and well-nourished.  HENT:  Head: Normocephalic and atraumatic.  Right Ear: External ear normal.  Left Ear: External ear normal.  Nose: Nose normal.  Mouth/Throat: Oropharynx is clear and moist.  Eyes: Pupils are equal, round, and reactive to light. Conjunctivae and EOM are normal.  Neck: Normal range of motion. Neck supple.  Cardiovascular: Normal rate, regular rhythm and normal heart sounds.  Pulmonary/Chest: Effort normal and breath sounds normal.  Musculoskeletal: Normal range of motion.  Lymphadenopathy:    She has no cervical adenopathy.  Neurological: She is alert and oriented to person, place, and time.  Good grasps  Skin: Skin is warm and dry.  Early clubbing of fingernails  Psychiatric: She has a normal mood and affect.  Nursing note and vitals reviewed.    UC Treatments / Results  Labs (all labs ordered are listed, but only abnormal results are displayed) Labs Reviewed - No data to display  EKG None  Radiology No results found.  Procedures Procedures (including critical care time)  Medications Ordered in UC Medications - No data to display  Initial Impression / Assessment and Plan / UC Course  I have reviewed the triage vital signs and the nursing notes.  Pertinent labs & imaging results that were available during my care of the patient were reviewed by me and considered in my medical decision making (see chart for details).    Final Clinical Impressions(s) / UC Diagnoses   Final diagnoses:  None   Discharge Instructions   None    ED Prescriptions    None     Controlled Substance Prescriptions [place] Controlled Substance Registry  consulted? Not Applicable   Robyn Haber, MD 11/03/17 1446

## 2017-11-15 DIAGNOSIS — Z6830 Body mass index (BMI) 30.0-30.9, adult: Secondary | ICD-10-CM | POA: Diagnosis not present

## 2017-11-15 DIAGNOSIS — M7541 Impingement syndrome of right shoulder: Secondary | ICD-10-CM | POA: Diagnosis not present

## 2017-11-15 DIAGNOSIS — M19011 Primary osteoarthritis, right shoulder: Secondary | ICD-10-CM | POA: Diagnosis not present

## 2017-11-29 DIAGNOSIS — I10 Essential (primary) hypertension: Secondary | ICD-10-CM | POA: Diagnosis not present

## 2017-11-29 DIAGNOSIS — Z6830 Body mass index (BMI) 30.0-30.9, adult: Secondary | ICD-10-CM | POA: Diagnosis not present

## 2017-11-29 DIAGNOSIS — M7541 Impingement syndrome of right shoulder: Secondary | ICD-10-CM | POA: Diagnosis not present

## 2017-11-29 DIAGNOSIS — M19011 Primary osteoarthritis, right shoulder: Secondary | ICD-10-CM | POA: Diagnosis not present

## 2018-06-27 DIAGNOSIS — R5383 Other fatigue: Secondary | ICD-10-CM | POA: Diagnosis not present

## 2018-06-27 DIAGNOSIS — E559 Vitamin D deficiency, unspecified: Secondary | ICD-10-CM | POA: Diagnosis not present

## 2018-06-27 DIAGNOSIS — E782 Mixed hyperlipidemia: Secondary | ICD-10-CM | POA: Diagnosis not present

## 2018-06-27 DIAGNOSIS — Z0000 Encounter for general adult medical examination without abnormal findings: Secondary | ICD-10-CM | POA: Diagnosis not present

## 2018-06-27 DIAGNOSIS — I10 Essential (primary) hypertension: Secondary | ICD-10-CM | POA: Diagnosis not present

## 2018-06-29 DIAGNOSIS — E559 Vitamin D deficiency, unspecified: Secondary | ICD-10-CM | POA: Diagnosis not present

## 2018-06-29 DIAGNOSIS — I10 Essential (primary) hypertension: Secondary | ICD-10-CM | POA: Diagnosis not present

## 2018-06-29 DIAGNOSIS — R635 Abnormal weight gain: Secondary | ICD-10-CM | POA: Diagnosis not present

## 2018-06-29 DIAGNOSIS — G4700 Insomnia, unspecified: Secondary | ICD-10-CM | POA: Diagnosis not present

## 2018-07-27 DIAGNOSIS — I10 Essential (primary) hypertension: Secondary | ICD-10-CM | POA: Diagnosis not present

## 2018-07-27 DIAGNOSIS — G4700 Insomnia, unspecified: Secondary | ICD-10-CM | POA: Diagnosis not present

## 2018-07-27 DIAGNOSIS — Z719 Counseling, unspecified: Secondary | ICD-10-CM | POA: Diagnosis not present

## 2018-08-25 DIAGNOSIS — F411 Generalized anxiety disorder: Secondary | ICD-10-CM | POA: Diagnosis not present

## 2018-08-25 DIAGNOSIS — E559 Vitamin D deficiency, unspecified: Secondary | ICD-10-CM | POA: Diagnosis not present

## 2018-08-25 DIAGNOSIS — G4700 Insomnia, unspecified: Secondary | ICD-10-CM | POA: Diagnosis not present

## 2018-08-25 DIAGNOSIS — F331 Major depressive disorder, recurrent, moderate: Secondary | ICD-10-CM | POA: Diagnosis not present

## 2018-08-25 DIAGNOSIS — R5383 Other fatigue: Secondary | ICD-10-CM | POA: Diagnosis not present

## 2018-08-25 DIAGNOSIS — Z0000 Encounter for general adult medical examination without abnormal findings: Secondary | ICD-10-CM | POA: Diagnosis not present

## 2018-09-08 DIAGNOSIS — G4700 Insomnia, unspecified: Secondary | ICD-10-CM | POA: Diagnosis not present

## 2018-09-08 DIAGNOSIS — F411 Generalized anxiety disorder: Secondary | ICD-10-CM | POA: Diagnosis not present

## 2018-09-08 DIAGNOSIS — F331 Major depressive disorder, recurrent, moderate: Secondary | ICD-10-CM | POA: Diagnosis not present

## 2018-10-12 DIAGNOSIS — Z23 Encounter for immunization: Secondary | ICD-10-CM | POA: Diagnosis not present

## 2019-02-03 ENCOUNTER — Other Ambulatory Visit: Payer: Self-pay

## 2019-02-03 ENCOUNTER — Encounter (HOSPITAL_COMMUNITY): Payer: Self-pay | Admitting: Emergency Medicine

## 2019-02-03 ENCOUNTER — Ambulatory Visit (HOSPITAL_COMMUNITY)
Admission: EM | Admit: 2019-02-03 | Discharge: 2019-02-03 | Disposition: A | Discharge status: Home / Self Care | Payer: Medicare HMO | Attending: Family Medicine | Admitting: Family Medicine

## 2019-02-03 DIAGNOSIS — I10 Essential (primary) hypertension: Secondary | ICD-10-CM | POA: Diagnosis not present

## 2019-02-03 DIAGNOSIS — Z20822 Contact with and (suspected) exposure to covid-19: Secondary | ICD-10-CM | POA: Insufficient documentation

## 2019-02-03 NOTE — ED Triage Notes (Signed)
Pt states her husband was hospitalized for covid on Monday, and her son also has covid, he is isolated. Pt states she has no symptoms, just wants to be checked for covid. Pt instructed if covid is negative, recommend still isolate for 14 days.

## 2019-02-03 NOTE — Discharge Instructions (Signed)
Self isolate. See provided information about covid-19.  Will notify you by phone of any positive findings. Your negative results will be sent through your MyChart.

## 2019-02-03 NOTE — ED Provider Notes (Signed)
[place]    CSN: [id number] Arrival date & time: 02/03/19  [id number]      History   Chief Complaint Chief Complaint  Patient presents with  . Covid Exposure    HPI Rhonda Hart is a 74 y.o. female.   Rhonda Hart presents with requests for covid-19 testing. Her husband is currently hospitalized with covid-19. His symptoms started approximately 1 week ago. She feels well, denies any symptoms currently. No acute complaints. History of htn.     ROS per HPI, negative if not otherwise mentioned.      Past Medical History:  Diagnosis Date  . Arthritis   . History of colon polyps    benign  . Hyperlipidemia    has never been on meds  . Hypertension    takes Hyzaar,HCTZ,and Metoprolol daily  . Joint pain     Patient Active Problem List   Diagnosis Date Noted  . UTI (urinary tract infection) 04/21/2015  . Primary localized osteoarthritis of right knee 04/19/2015  . Hypertension   . Hyperlipidemia   . 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 ABDOMINAL HYSTERECTOMY    . CESAREAN SECTION    . colonosocpy    . JOINT REPLACEMENT    . TOTAL HIP ARTHROPLASTY     x 2 on right and x 1 on left  . TOTAL HIP REVISION Left 11/30/2014   Procedure: TOTAL LEFT HIP REVISION;  Surgeon: Earlie Server, MD;  Location: [facility];  Service: Orthopedics;  Laterality: Left;  . TOTAL KNEE ARTHROPLASTY Right 04/19/2015   Procedure: RIGHT TOTAL KNEE ARTHROPLASTY;  Surgeon: Earlie Server, MD;  Location: [facility];  Service: Orthopedics;  Laterality: Right;    OB History   No obstetric history on file.      Home Medications    Prior to Admission medications   Medication Sig Start Date End Date Taking? Authorizing Provider  albuterol (PROVENTIL HFA;VENTOLIN HFA) 108 (90 Base) MCG/ACT inhaler Inhale 1-2 puffs into the lungs every 6 (six) hours as needed for up  to 7 days for wheezing or shortness of breath. 01/31/17 02/07/17  Wieters, Hallie C, PA-C  amLODipine (NORVASC) 2.5 MG tablet Take 2.5 mg by mouth daily.    [provider]  aspirin 81 MG tablet Take 81 mg by mouth daily.    [provider]  losartan-hydrochlorothiazide (HYZAAR) 100-25 MG tablet Take 1 tablet by mouth daily. 02/05/15   [provider]  metoprolol succinate (TOPROL-XL) 50 MG 24 hr tablet Take 50 mg by mouth daily. 03/10/13   [provider]  predniSONE (DELTASONE) 50 MG tablet 1 tablet daily 11/03/17   Robyn Haber, MD    Family History No family history on file.  Social History Social History   Tobacco Use  . Smoking status: Never Smoker  . Smokeless tobacco: Never Used  Substance Use Topics  . Alcohol use: No  . Drug use: No     Allergies   Coumadin [warfarin sodium]   Review of Systems Review of Systems   Physical Exam Triage Vital Signs ED Triage Vitals  Enc Vitals Group     BP 02/03/19 1040 (!) 172/82     Pulse Rate 02/03/19 1040 98     Resp 02/03/19 1040 18     Temp 02/03/19 1040 99.3 F (37.4 C)     Temp src --  SpO2 02/03/19 1040 99 %     Weight --      Height --      Head Circumference --      Peak Flow --      Pain Score 02/03/19 1042 0     Pain Loc --      Pain Edu? --      Excl. in [place]? --    No data found.  Updated Vital Signs BP (!) 172/82   Pulse 98   Temp 99.3 F (37.4 C)   Resp 18   SpO2 99%    Physical Exam Constitutional:      General: She is not in acute distress.    Appearance: She is well-developed.  Cardiovascular:     Rate and Rhythm: Normal rate.  Pulmonary:     Effort: Pulmonary effort is normal.  Skin:    General: Skin is warm and dry.  Neurological:     Mental Status: She is alert and oriented to person, place, and time.      UC Treatments / Results  Labs (all labs ordered are listed, but only abnormal results are displayed) Labs Reviewed  NOVEL  CORONAVIRUS, NAA (HOSP ORDER, SEND-OUT TO REF LAB; TAT 18-24 HRS)    EKG   Radiology No results found.  Procedures Procedures (including critical care time)  Medications Ordered in UC Medications - No data to display  Initial Impression / Assessment and Plan / UC Course  I have reviewed the triage vital signs and the nursing notes.  Pertinent labs & imaging results that were available during my care of the patient were reviewed by me and considered in my medical decision making (see chart for details).     covid screening due to exposure, her husband is currently hospitalized with covid-19. Signs and symptoms and treatments discussed if she is to get sick. Return precautions provided. Patient verbalized understanding and agreeable to plan.   Final Clinical Impressions(s) / UC Diagnoses   Final diagnoses:  Exposure to COVID-19 virus  Encounter for laboratory testing for COVID-19 virus     Discharge Instructions     Self isolate. See provided information about covid-19.  Will notify you by phone of any positive findings. Your negative results will be sent through your MyChart.        ED Prescriptions    None     PDMP not reviewed this encounter.   Zigmund Gottron, NP 02/03/19 [phone]

## 2019-02-05 LAB — NOVEL CORONAVIRUS, NAA (HOSP ORDER, SEND-OUT TO REF LAB; TAT 18-24 HRS): SARS-CoV-2, NAA: NOT DETECTED

## 2019-03-31 DIAGNOSIS — F411 Generalized anxiety disorder: Secondary | ICD-10-CM | POA: Diagnosis not present

## 2019-03-31 DIAGNOSIS — I10 Essential (primary) hypertension: Secondary | ICD-10-CM | POA: Diagnosis not present

## 2019-03-31 DIAGNOSIS — G4700 Insomnia, unspecified: Secondary | ICD-10-CM | POA: Diagnosis not present

## 2019-03-31 DIAGNOSIS — F331 Major depressive disorder, recurrent, moderate: Secondary | ICD-10-CM | POA: Diagnosis not present

## 2019-04-28 ENCOUNTER — Other Ambulatory Visit: Payer: Self-pay | Admitting: Family Medicine

## 2019-04-28 DIAGNOSIS — Z1231 Encounter for screening mammogram for malignant neoplasm of breast: Secondary | ICD-10-CM

## 2019-05-12 DIAGNOSIS — M7541 Impingement syndrome of right shoulder: Secondary | ICD-10-CM | POA: Diagnosis not present

## 2019-05-18 DIAGNOSIS — M19011 Primary osteoarthritis, right shoulder: Secondary | ICD-10-CM | POA: Diagnosis not present

## 2019-05-18 DIAGNOSIS — M7541 Impingement syndrome of right shoulder: Secondary | ICD-10-CM | POA: Diagnosis not present

## 2019-05-18 DIAGNOSIS — Z1159 Encounter for screening for other viral diseases: Secondary | ICD-10-CM | POA: Diagnosis not present

## 2019-08-28 DIAGNOSIS — Z1331 Encounter for screening for depression: Secondary | ICD-10-CM | POA: Diagnosis not present

## 2019-08-28 DIAGNOSIS — Z0000 Encounter for general adult medical examination without abnormal findings: Secondary | ICD-10-CM | POA: Diagnosis not present

## 2019-08-28 DIAGNOSIS — Z1339 Encounter for screening examination for other mental health and behavioral disorders: Secondary | ICD-10-CM | POA: Diagnosis not present

## 2019-09-21 ENCOUNTER — Other Ambulatory Visit: Payer: Self-pay | Admitting: Family Medicine

## 2019-09-21 DIAGNOSIS — Z1231 Encounter for screening mammogram for malignant neoplasm of breast: Secondary | ICD-10-CM

## 2019-10-10 ENCOUNTER — Other Ambulatory Visit: Payer: Self-pay

## 2019-10-10 ENCOUNTER — Ambulatory Visit
Admission: RE | Admit: 2019-10-10 | Discharge: 2019-10-10 | Disposition: A | Discharge status: Home / Self Care | Payer: Medicare HMO | Source: Ambulatory Visit | Attending: Family Medicine | Admitting: Family Medicine

## 2019-10-10 DIAGNOSIS — Z1231 Encounter for screening mammogram for malignant neoplasm of breast: Secondary | ICD-10-CM

## 2019-10-13 ENCOUNTER — Ambulatory Visit: Payer: Medicare HMO

## 2019-10-13 DIAGNOSIS — E782 Mixed hyperlipidemia: Secondary | ICD-10-CM | POA: Diagnosis not present

## 2019-10-13 DIAGNOSIS — E559 Vitamin D deficiency, unspecified: Secondary | ICD-10-CM | POA: Diagnosis not present

## 2019-10-13 DIAGNOSIS — G4700 Insomnia, unspecified: Secondary | ICD-10-CM | POA: Diagnosis not present

## 2019-10-13 DIAGNOSIS — I10 Essential (primary) hypertension: Secondary | ICD-10-CM | POA: Diagnosis not present

## 2019-10-13 DIAGNOSIS — F411 Generalized anxiety disorder: Secondary | ICD-10-CM | POA: Diagnosis not present

## 2019-11-20 DIAGNOSIS — E559 Vitamin D deficiency, unspecified: Secondary | ICD-10-CM | POA: Diagnosis not present

## 2019-11-20 DIAGNOSIS — E782 Mixed hyperlipidemia: Secondary | ICD-10-CM | POA: Diagnosis not present

## 2019-11-20 DIAGNOSIS — G4700 Insomnia, unspecified: Secondary | ICD-10-CM | POA: Diagnosis not present

## 2019-11-20 DIAGNOSIS — I10 Essential (primary) hypertension: Secondary | ICD-10-CM | POA: Diagnosis not present

## 2019-11-22 ENCOUNTER — Ambulatory Visit
Admission: RE | Admit: 2019-11-22 | Discharge: 2019-11-22 | Disposition: A | Discharge status: Home / Self Care | Payer: Medicare HMO | Source: Ambulatory Visit | Attending: Family Medicine | Admitting: Family Medicine

## 2019-11-22 ENCOUNTER — Other Ambulatory Visit: Payer: Self-pay

## 2019-11-22 DIAGNOSIS — Z1231 Encounter for screening mammogram for malignant neoplasm of breast: Secondary | ICD-10-CM | POA: Diagnosis not present

## 2019-11-23 DIAGNOSIS — F331 Major depressive disorder, recurrent, moderate: Secondary | ICD-10-CM | POA: Diagnosis not present

## 2019-11-23 DIAGNOSIS — F411 Generalized anxiety disorder: Secondary | ICD-10-CM | POA: Diagnosis not present

## 2019-11-23 DIAGNOSIS — I10 Essential (primary) hypertension: Secondary | ICD-10-CM | POA: Diagnosis not present

## 2019-11-23 DIAGNOSIS — G4700 Insomnia, unspecified: Secondary | ICD-10-CM | POA: Diagnosis not present

## 2019-11-23 DIAGNOSIS — E782 Mixed hyperlipidemia: Secondary | ICD-10-CM | POA: Diagnosis not present

## 2019-11-23 DIAGNOSIS — E1169 Type 2 diabetes mellitus with other specified complication: Secondary | ICD-10-CM | POA: Diagnosis not present

## 2019-11-23 DIAGNOSIS — E559 Vitamin D deficiency, unspecified: Secondary | ICD-10-CM | POA: Diagnosis not present

## 2020-02-01 DIAGNOSIS — M25562 Pain in left knee: Secondary | ICD-10-CM | POA: Diagnosis not present

## 2020-02-12 DIAGNOSIS — M25562 Pain in left knee: Secondary | ICD-10-CM | POA: Diagnosis not present

## 2020-05-21 DIAGNOSIS — F411 Generalized anxiety disorder: Secondary | ICD-10-CM | POA: Diagnosis not present

## 2020-05-21 DIAGNOSIS — E559 Vitamin D deficiency, unspecified: Secondary | ICD-10-CM | POA: Diagnosis not present

## 2020-05-21 DIAGNOSIS — I10 Essential (primary) hypertension: Secondary | ICD-10-CM | POA: Diagnosis not present

## 2020-05-21 DIAGNOSIS — F331 Major depressive disorder, recurrent, moderate: Secondary | ICD-10-CM | POA: Diagnosis not present

## 2020-05-21 DIAGNOSIS — Z7185 Encounter for immunization safety counseling: Secondary | ICD-10-CM | POA: Diagnosis not present

## 2020-05-21 DIAGNOSIS — G5602 Carpal tunnel syndrome, left upper limb: Secondary | ICD-10-CM | POA: Diagnosis not present

## 2020-05-21 DIAGNOSIS — Z23 Encounter for immunization: Secondary | ICD-10-CM | POA: Diagnosis not present

## 2020-06-24 DIAGNOSIS — E559 Vitamin D deficiency, unspecified: Secondary | ICD-10-CM | POA: Diagnosis not present

## 2020-07-02 DIAGNOSIS — G4700 Insomnia, unspecified: Secondary | ICD-10-CM | POA: Diagnosis not present

## 2020-07-02 DIAGNOSIS — I10 Essential (primary) hypertension: Secondary | ICD-10-CM | POA: Diagnosis not present

## 2020-07-02 DIAGNOSIS — E559 Vitamin D deficiency, unspecified: Secondary | ICD-10-CM | POA: Diagnosis not present

## 2020-07-02 DIAGNOSIS — G5602 Carpal tunnel syndrome, left upper limb: Secondary | ICD-10-CM | POA: Diagnosis not present

## 2020-07-02 DIAGNOSIS — M19042 Primary osteoarthritis, left hand: Secondary | ICD-10-CM | POA: Diagnosis not present

## 2020-07-05 DIAGNOSIS — E669 Obesity, unspecified: Secondary | ICD-10-CM | POA: Diagnosis not present

## 2020-07-05 DIAGNOSIS — Z008 Encounter for other general examination: Secondary | ICD-10-CM | POA: Diagnosis not present

## 2020-07-05 DIAGNOSIS — Z0000 Encounter for general adult medical examination without abnormal findings: Secondary | ICD-10-CM | POA: Diagnosis not present

## 2020-07-05 DIAGNOSIS — Z6831 Body mass index (BMI) 31.0-31.9, adult: Secondary | ICD-10-CM | POA: Diagnosis not present

## 2020-08-28 DIAGNOSIS — Z1331 Encounter for screening for depression: Secondary | ICD-10-CM | POA: Diagnosis not present

## 2020-08-28 DIAGNOSIS — Z0000 Encounter for general adult medical examination without abnormal findings: Secondary | ICD-10-CM | POA: Diagnosis not present

## 2020-08-28 DIAGNOSIS — Z1339 Encounter for screening examination for other mental health and behavioral disorders: Secondary | ICD-10-CM | POA: Diagnosis not present

## 2020-08-30 DIAGNOSIS — G5602 Carpal tunnel syndrome, left upper limb: Secondary | ICD-10-CM | POA: Diagnosis not present

## 2020-09-27 DIAGNOSIS — G5602 Carpal tunnel syndrome, left upper limb: Secondary | ICD-10-CM | POA: Diagnosis not present

## 2020-09-27 DIAGNOSIS — M25512 Pain in left shoulder: Secondary | ICD-10-CM | POA: Diagnosis not present

## 2020-10-25 DIAGNOSIS — M25512 Pain in left shoulder: Secondary | ICD-10-CM | POA: Diagnosis not present

## 2020-10-25 DIAGNOSIS — G5602 Carpal tunnel syndrome, left upper limb: Secondary | ICD-10-CM | POA: Diagnosis not present

## 2020-10-29 ENCOUNTER — Other Ambulatory Visit: Payer: Self-pay | Admitting: Physician Assistant

## 2020-10-29 DIAGNOSIS — M25512 Pain in left shoulder: Secondary | ICD-10-CM

## 2020-11-10 ENCOUNTER — Other Ambulatory Visit: Payer: Self-pay

## 2020-11-10 ENCOUNTER — Ambulatory Visit
Admission: RE | Admit: 2020-11-10 | Discharge: 2020-11-10 | Disposition: A | Discharge status: Home / Self Care | Payer: Medicare HMO | Source: Ambulatory Visit | Attending: Physician Assistant | Admitting: Physician Assistant

## 2020-11-10 DIAGNOSIS — M25512 Pain in left shoulder: Secondary | ICD-10-CM

## 2020-11-10 DIAGNOSIS — M75102 Unspecified rotator cuff tear or rupture of left shoulder, not specified as traumatic: Secondary | ICD-10-CM | POA: Diagnosis not present

## 2020-11-14 DIAGNOSIS — M25512 Pain in left shoulder: Secondary | ICD-10-CM | POA: Diagnosis not present

## 2020-11-26 DIAGNOSIS — M25512 Pain in left shoulder: Secondary | ICD-10-CM | POA: Diagnosis not present

## 2020-12-09 DIAGNOSIS — M25512 Pain in left shoulder: Secondary | ICD-10-CM | POA: Diagnosis not present

## 2020-12-11 DIAGNOSIS — M7542 Impingement syndrome of left shoulder: Secondary | ICD-10-CM | POA: Diagnosis not present

## 2020-12-11 DIAGNOSIS — M25512 Pain in left shoulder: Secondary | ICD-10-CM | POA: Diagnosis not present

## 2020-12-31 DIAGNOSIS — I890 Lymphedema, not elsewhere classified: Secondary | ICD-10-CM | POA: Diagnosis not present

## 2020-12-31 DIAGNOSIS — M7542 Impingement syndrome of left shoulder: Secondary | ICD-10-CM | POA: Diagnosis not present

## 2020-12-31 DIAGNOSIS — G8918 Other acute postprocedural pain: Secondary | ICD-10-CM | POA: Diagnosis not present

## 2020-12-31 DIAGNOSIS — S43431A Superior glenoid labrum lesion of right shoulder, initial encounter: Secondary | ICD-10-CM | POA: Diagnosis not present

## 2020-12-31 DIAGNOSIS — M7541 Impingement syndrome of right shoulder: Secondary | ICD-10-CM | POA: Diagnosis not present

## 2020-12-31 DIAGNOSIS — M24012 Loose body in left shoulder: Secondary | ICD-10-CM | POA: Diagnosis not present

## 2020-12-31 DIAGNOSIS — E118 Type 2 diabetes mellitus with unspecified complications: Secondary | ICD-10-CM | POA: Diagnosis not present

## 2020-12-31 DIAGNOSIS — Z79899 Other long term (current) drug therapy: Secondary | ICD-10-CM | POA: Diagnosis not present

## 2020-12-31 DIAGNOSIS — M19011 Primary osteoarthritis, right shoulder: Secondary | ICD-10-CM | POA: Diagnosis not present

## 2020-12-31 DIAGNOSIS — S46111A Strain of muscle, fascia and tendon of long head of biceps, right arm, initial encounter: Secondary | ICD-10-CM | POA: Diagnosis not present

## 2020-12-31 DIAGNOSIS — M659 Synovitis and tenosynovitis, unspecified: Secondary | ICD-10-CM | POA: Diagnosis not present

## 2020-12-31 DIAGNOSIS — M25512 Pain in left shoulder: Secondary | ICD-10-CM | POA: Diagnosis not present

## 2020-12-31 DIAGNOSIS — Z4889 Encounter for other specified surgical aftercare: Secondary | ICD-10-CM | POA: Diagnosis not present

## 2020-12-31 DIAGNOSIS — M24111 Other articular cartilage disorders, right shoulder: Secondary | ICD-10-CM | POA: Diagnosis not present

## 2020-12-31 DIAGNOSIS — M65811 Other synovitis and tenosynovitis, right shoulder: Secondary | ICD-10-CM | POA: Diagnosis not present

## 2020-12-31 DIAGNOSIS — M75111 Incomplete rotator cuff tear or rupture of right shoulder, not specified as traumatic: Secondary | ICD-10-CM | POA: Diagnosis not present

## 2020-12-31 DIAGNOSIS — M75102 Unspecified rotator cuff tear or rupture of left shoulder, not specified as traumatic: Secondary | ICD-10-CM | POA: Diagnosis not present

## 2020-12-31 DIAGNOSIS — Z791 Long term (current) use of non-steroidal anti-inflammatories (NSAID): Secondary | ICD-10-CM | POA: Diagnosis not present

## 2021-01-09 DIAGNOSIS — M25512 Pain in left shoulder: Secondary | ICD-10-CM | POA: Diagnosis not present

## 2021-01-11 DIAGNOSIS — M75102 Unspecified rotator cuff tear or rupture of left shoulder, not specified as traumatic: Secondary | ICD-10-CM | POA: Diagnosis not present

## 2021-01-11 DIAGNOSIS — Z79899 Other long term (current) drug therapy: Secondary | ICD-10-CM | POA: Diagnosis not present

## 2021-01-11 DIAGNOSIS — M24012 Loose body in left shoulder: Secondary | ICD-10-CM | POA: Diagnosis not present

## 2021-01-11 DIAGNOSIS — E118 Type 2 diabetes mellitus with unspecified complications: Secondary | ICD-10-CM | POA: Diagnosis not present

## 2021-01-11 DIAGNOSIS — Z4889 Encounter for other specified surgical aftercare: Secondary | ICD-10-CM | POA: Diagnosis not present

## 2021-01-11 DIAGNOSIS — M25512 Pain in left shoulder: Secondary | ICD-10-CM | POA: Diagnosis not present

## 2021-01-11 DIAGNOSIS — M7542 Impingement syndrome of left shoulder: Secondary | ICD-10-CM | POA: Diagnosis not present

## 2021-01-11 DIAGNOSIS — I890 Lymphedema, not elsewhere classified: Secondary | ICD-10-CM | POA: Diagnosis not present

## 2021-01-11 DIAGNOSIS — Z791 Long term (current) use of non-steroidal anti-inflammatories (NSAID): Secondary | ICD-10-CM | POA: Diagnosis not present

## 2021-01-20 DIAGNOSIS — M25512 Pain in left shoulder: Secondary | ICD-10-CM | POA: Diagnosis not present

## 2021-01-27 DIAGNOSIS — M25512 Pain in left shoulder: Secondary | ICD-10-CM | POA: Diagnosis not present

## 2021-02-03 DIAGNOSIS — M25512 Pain in left shoulder: Secondary | ICD-10-CM | POA: Diagnosis not present

## 2021-02-10 DIAGNOSIS — M25512 Pain in left shoulder: Secondary | ICD-10-CM | POA: Diagnosis not present

## 2021-02-17 DIAGNOSIS — M25512 Pain in left shoulder: Secondary | ICD-10-CM | POA: Diagnosis not present

## 2021-02-24 DIAGNOSIS — M25512 Pain in left shoulder: Secondary | ICD-10-CM | POA: Diagnosis not present

## 2021-03-03 DIAGNOSIS — M25512 Pain in left shoulder: Secondary | ICD-10-CM | POA: Diagnosis not present

## 2021-03-10 DIAGNOSIS — M25512 Pain in left shoulder: Secondary | ICD-10-CM | POA: Diagnosis not present

## 2021-07-11 NOTE — Patient Instructions (Addendum)
DUE TO COVID-19 ONLY TWO VISITORS  (aged 75 and older)  IS ALLOWED TO COME WITH YOU AND STAY IN THE WAITING ROOM ONLY DURING PRE OP AND PROCEDURE.   **NO VISITORS ARE ALLOWED IN THE SHORT STAY AREA OR RECOVERY ROOM!!**  IF YOU WILL BE ADMITTED INTO THE HOSPITAL YOU ARE ALLOWED ONLY FOUR SUPPORT PEOPLE DURING VISITATION HOURS ONLY (7 AM -8PM)   The support person(s) must pass our screening, gel in and out Visitors GUEST BADGE MUST BE WORN VISIBLY  One adult visitor may remain with you overnight and MUST be in the room by 8 P.M.   You are not required to [employer] often Do NOT share personal items Notify your provider if you are in close contact with someone who has COVID or you develop fever 100.4 or greater, new onset of sneezing, cough, sore throat, shortness of breath or body aches.        Your procedure is scheduled on:  07-29-21   Report to [facility] Main Entrance    Report to admitting at 5:15 AM   Call this number if you have problems the morning of surgery [phone]   Do not eat food :After Midnight the night before surgery   After Midnight you may have the following liquids until 4:15 AM/ DAY OF SURGERY  Clear Liquid Diet Water Black Coffee (sugar ok, NO MILK/CREAM OR CREAMERS)  Tea (sugar ok, NO MILK/CREAM OR CREAMERS) regular and decaf                             Plain Jell-O (NO RED)                                           Fruit ices (not with fruit pulp, NO RED)                                     Popsicles (NO RED)                                                                  Juice: apple, WHITE grape, WHITE cranberry Sports drinks like Gatorade (NO RED) Clear broth(vegetable,chicken,beef)                   The day of surgery:  Drink ONE (1) Pre-Surgery Clear Ensure  at 4:15 AM the morning of surgery. Drink in one sitting. Do not sip.  This drink was given to you during your hospital  pre-op appointment visit. Nothing else to  drink after completing the Pre-Surgery Clear Ensure          If you have questions, please contact your surgeon's office.   FOLLOW ANY ADDITIONAL PRE OP INSTRUCTIONS YOU RECEIVED FROM YOUR SURGEON'S OFFICE!!!     Oral Hygiene is also important to reduce your risk of infection.  Remember - BRUSH YOUR TEETH THE MORNING OF SURGERY WITH YOUR REGULAR TOOTHPASTE   Do NOT smoke after Midnight   Take these medicines the morning of surgery with A SIP OF WATER: Amlodipine, Ctalopram, Claritin, Metoprolol, Pnatoprazole   DO NOT BRING YOUR HOME MEDICATIONS TO [place]. PHARMACY WILL DISPENSE MEDICATIONS LISTED ON YOUR MEDICATION LIST TO YOU DURING YOUR ADMISSION [place]!  DO NOT TAKE ANY ORAL DIABETIC MEDICATIONS DAY OF YOUR SURGERY  Bring CPAP mask and tubing day of surgery.                              You may not have any metal on your body including hair pins, jewelry, and body piercing             Do not wear make-up, lotions, powders, perfumes or deodorant  Do not wear nail polish including gel and S&S, artificial/acrylic nails, or any other type of covering on natural nails including finger and toenails. If you have artificial nails, gel coating, etc. that needs to be removed by a nail salon please have this removed prior to surgery or surgery may need to be canceled/ delayed if the surgeon/ anesthesia feels like they are unable to be safely monitored.   Do not shave  48 hours prior to surgery.    Contacts, dentures or bridgework may not be worn into surgery.   Bring small overnight bag day of surgery.  Do not bring valuables to the hospital. [place].   Special Instructions: Bring a copy of your healthcare power of attorney and living will documents the day of surgery if you haven't scanned them before.  Please read over the following fact sheets you were given: IF [place] or [phone] (7/10-7/14)  [facility] - Preparing for Surgery Before surgery, you can play an important role.  Because skin is not sterile, your skin needs to be as free of germs as possible.  You can reduce the number of germs on your skin by washing with CHG (chlorahexidine gluconate) soap before surgery.  CHG is an antiseptic cleaner which kills germs and bonds with the skin to continue killing germs even after washing. Please DO NOT use if you have an allergy to CHG or antibacterial soaps.  If your skin becomes reddened/irritated stop using the CHG and inform your nurse when you arrive at Short Stay. Do not shave (including legs and underarms) for at least 48 hours prior to the first CHG shower.  You may shave your face/neck.  Please follow these instructions carefully:  1.  Shower with CHG Soap the night before surgery and the  morning of surgery.  2.  If you choose to wash your hair, wash your hair first as usual with your normal  shampoo.  3.  After you shampoo, rinse your hair and body thoroughly to remove the shampoo.                             4.  Use CHG as you would any other liquid soap.  You can apply chg directly to the skin and wash.  Gently with a scrungie or clean washcloth.  5.  Apply the CHG Soap to your body ONLY FROM THE NECK DOWN.   Do   not use on  face/ open                           Wound or open sores. Avoid contact with eyes, ears mouth and   genitals (private parts).                       Wash face,  Genitals (private parts) with your normal soap.             6.  Wash thoroughly, paying special attention to the area where your    surgery  will be performed.  7.  Thoroughly rinse your body with warm water from the neck down.  8.  DO NOT shower/wash with your normal soap after using and rinsing off the CHG Soap.                9.  Pat yourself dry with a clean towel.            10.  Wear clean pajamas.            11.   Place clean sheets on your bed the night of your first shower and do not  sleep with pets. Day of Surgery : Do not apply any lotions/deodorants the morning of surgery.  Please wear clean clothes to the hospital/surgery center.  FAILURE TO FOLLOW THESE INSTRUCTIONS MAY RESULT IN THE CANCELLATION OF YOUR SURGERY  PATIENT SIGNATURE_________________________________  NURSE SIGNATURE__________________________________  ________________________________________________________________________     Adam Phenix  An incentive spirometer is a tool that can help keep your lungs clear and active. This tool measures how well you are filling your lungs with each breath. Taking long deep breaths may help reverse or decrease the chance of developing breathing (pulmonary) problems (especially infection) following: A long period of time when you are unable to move or be active. BEFORE THE PROCEDURE  If the spirometer includes an indicator to show your best effort, your nurse or respiratory therapist will set it to a desired goal. If possible, sit up straight or lean slightly forward. Try not to slouch. Hold the incentive spirometer in an upright position. INSTRUCTIONS FOR USE  Sit on the edge of your bed if possible, or sit up as far as you can in bed or on a chair. Hold the incentive spirometer in an upright position. Breathe out normally. Place the mouthpiece in your mouth and seal your lips tightly around it. Breathe in slowly and as deeply as possible, raising the piston or the ball toward the top of the column. Hold your breath for 3-5 seconds or for as long as possible. Allow the piston or ball to fall to the bottom of the column. Remove the mouthpiece from your mouth and breathe out normally. Rest for a few seconds and repeat Steps 1 through 7 at least 10 times every 1-2 hours when you are awake. Take your time and take a few normal breaths between deep breaths. The spirometer may include an  indicator to show your best effort. Use the indicator as a goal to work toward during each repetition. After each set of 10 deep breaths, practice coughing to be sure your lungs are clear. If you have an incision (the cut made at the time of surgery), support your incision when coughing by placing a pillow or rolled up towels firmly against it. Once you are able to get out of bed, walk around indoors and cough well. You may stop  using the incentive spirometer when instructed by your caregiver.  RISKS AND COMPLICATIONS Take your time so you do not get dizzy or light-headed. If you are in pain, you may need to take or ask for pain medication before doing incentive spirometry. It is harder to take a deep breath if you are having pain. AFTER USE Rest and breathe slowly and easily. It can be helpful to keep track of a log of your progress. Your caregiver can provide you with a simple table to help with this. If you are using the spirometer at home, follow these instructions: [place] IF:  You are having difficultly using the spirometer. You have trouble using the spirometer as often as instructed. Your pain medication is not giving enough relief while using the spirometer. You develop fever of 100.5 F (38.1 C) or higher. SEEK IMMEDIATE MEDICAL CARE IF:  You cough up bloody sputum that had not been present before. You develop fever of 102 F (38.9 C) or greater. You develop worsening pain at or near the incision site. MAKE SURE YOU:  Understand these instructions. Will watch your condition. Will get help right away if you are not doing well or get worse. Document Released: 05/11/2006 Document Revised: 03/23/2011 Document Reviewed: 07/12/2006 ExitCare Patient Information 2014 ExitCare, [place].   ________________________________________________________________________   WHAT IS A BLOOD TRANSFUSION? Blood Transfusion Information  A transfusion is the replacement of blood or some of  its parts. Blood is made up of multiple cells which provide different functions. Red blood cells carry oxygen and are used for blood loss replacement. White blood cells fight against infection. Platelets control bleeding. Plasma helps clot blood. Other blood products are available for specialized needs, such as hemophilia or other clotting disorders. BEFORE THE TRANSFUSION  Who gives blood for transfusions?  Healthy volunteers who are fully evaluated to make sure their blood is safe. This is blood bank blood. Transfusion therapy is the safest it has ever been in the practice of medicine. Before blood is taken from a donor, a complete history is taken to make sure that person has no history of diseases nor engages in risky social behavior (examples are intravenous drug use or sexual activity with multiple partners). The donor's travel history is screened to minimize risk of transmitting infections, such as malaria. The donated blood is tested for signs of infectious diseases, such as HIV and hepatitis. The blood is then tested to be sure it is compatible with you in order to minimize the chance of a transfusion reaction. If you or a relative donates blood, this is often done in anticipation of surgery and is not appropriate for emergency situations. It takes many days to process the donated blood. RISKS AND COMPLICATIONS Although transfusion therapy is very safe and saves many lives, the main dangers of transfusion include:  Getting an infectious disease. Developing a transfusion reaction. This is an allergic reaction to something in the blood you were given. Every precaution is taken to prevent this. The decision to have a blood transfusion has been considered carefully by your caregiver before blood is given. Blood is not given unless the benefits outweigh the risks. AFTER THE TRANSFUSION Right after receiving a blood transfusion, you will usually feel much better and more energetic. This is  especially true if your red blood cells have gotten low (anemic). The transfusion raises the level of the red blood cells which carry oxygen, and this usually causes an energy increase. The nurse administering the transfusion will monitor  you carefully for complications. HOME CARE INSTRUCTIONS  No special instructions are needed after a transfusion. You may find your energy is better. Speak with your caregiver about any limitations on activity for underlying diseases you may have. SEEK MEDICAL CARE IF:  Your condition is not improving after your transfusion. You develop redness or irritation at the intravenous (IV) site. SEEK IMMEDIATE MEDICAL CARE IF:  Any of the following symptoms occur over the next 12 hours: Shaking chills. You have a temperature by mouth above 102 F (38.9 C), not controlled by medicine. Chest, back, or muscle pain. People around you feel you are not acting correctly or are confused. Shortness of breath or difficulty breathing. Dizziness and fainting. You get a rash or develop hives. You have a decrease in urine output. Your urine turns a dark color or changes to pink, red, or brown. Any of the following symptoms occur over the next 10 days: You have a temperature by mouth above 102 F (38.9 C), not controlled by medicine. Shortness of breath. Weakness after normal activity. The white part of the eye turns yellow (jaundice). You have a decrease in the amount of urine or are urinating less often. Your urine turns a dark color or changes to pink, red, or brown. Document Released: 12/27/1999 Document Revised: 03/23/2011 Document Reviewed: 08/15/2007 [facility] Patient Information 2014 [place], [place].  _______________________________________________________________________

## 2021-07-11 NOTE — Progress Notes (Addendum)
COVID Vaccine Completed:  Date of COVID positive in last 90 days:  PCP - Rachell Cipro, MD Cardiologist -   Medical clearance on chart dated 05-29-21 by Dr. Antony Blackbird (moderate risk due to age)  Chest x-ray -  EKG - 04-29-21 on chart Stress Test - greater than 2 years Epic ECHO - 04-29-21 on chart Cardiac Cath -  Pacemaker/ICD device last checked: Spinal Cord Stimulator:  Bowel Prep -   Sleep Study -  CPAP -   Fasting Blood Sugar -  Checks Blood Sugar _____ times a day  Blood Thinner Instructions: Aspirin Instructions: ASA 81  Last Dose:  Activity level:  Can go up a flight of stairs and perform activities of daily living without stopping and without symptoms of chest pain or shortness of breath.  Able to exercise without symptoms  Unable to go up a flight of stairs without symptoms of     Anesthesia review:   Patient denies shortness of breath, fever, cough and chest pain at PAT appointment  Patient verbalized understanding of instructions that were given to them at the PAT appointment. Patient was also instructed that they will need to review over the PAT instructions again at home before surgery.

## 2021-07-16 ENCOUNTER — Encounter (HOSPITAL_COMMUNITY): Payer: Self-pay

## 2021-07-16 ENCOUNTER — Other Ambulatory Visit: Payer: Self-pay

## 2021-07-16 ENCOUNTER — Encounter (HOSPITAL_COMMUNITY)
Admission: RE | Admit: 2021-07-16 | Discharge: 2021-07-16 | Disposition: A | Discharge status: Home / Self Care | Payer: Medicare HMO | Source: Ambulatory Visit | Attending: Orthopedic Surgery | Admitting: Orthopedic Surgery

## 2021-07-16 VITALS — BP 152/70 | HR 61 | Temp 99.0°F | Resp 20 | Ht 61.0 in | Wt 157.4 lb

## 2021-07-16 DIAGNOSIS — M1712 Unilateral primary osteoarthritis, left knee: Secondary | ICD-10-CM | POA: Insufficient documentation

## 2021-07-16 DIAGNOSIS — I2510 Atherosclerotic heart disease of native coronary artery without angina pectoris: Secondary | ICD-10-CM | POA: Diagnosis not present

## 2021-07-16 DIAGNOSIS — Z01818 Encounter for other preprocedural examination: Secondary | ICD-10-CM

## 2021-07-16 DIAGNOSIS — Z01812 Encounter for preprocedural laboratory examination: Secondary | ICD-10-CM | POA: Insufficient documentation

## 2021-07-16 HISTORY — DX: Gastro-esophageal reflux disease without esophagitis: K21.9

## 2021-07-16 LAB — CBC
HCT: 36.5 % (ref 36.0–46.0)
Hemoglobin: 11.8 g/dL — ABNORMAL LOW (ref 12.0–15.0)
MCH: 30.6 pg (ref 26.0–34.0)
MCHC: 32.3 g/dL (ref 30.0–36.0)
MCV: 94.6 fL (ref 80.0–100.0)
Platelets: 315 10*3/uL (ref 150–400)
RBC: 3.86 MIL/uL — ABNORMAL LOW (ref 3.87–5.11)
RDW: 13.6 % (ref 11.5–15.5)
WBC: 6.7 10*3/uL (ref 4.0–10.5)
nRBC: 0 % (ref 0.0–0.2)

## 2021-07-16 LAB — SURGICAL PCR SCREEN
MRSA, PCR: NEGATIVE
Staphylococcus aureus: NEGATIVE

## 2021-07-16 LAB — BASIC METABOLIC PANEL
Anion gap: 8 (ref 5–15)
BUN: 22 mg/dL (ref 8–23)
CO2: 25 mmol/L (ref 22–32)
Calcium: 9.7 mg/dL (ref 8.9–10.3)
Chloride: 108 mmol/L (ref 98–111)
Creatinine, Ser: 1.03 mg/dL — ABNORMAL HIGH (ref 0.44–1.00)
GFR, Estimated: 57 mL/min — ABNORMAL LOW (ref >60)
Glucose, Bld: 105 mg/dL — ABNORMAL HIGH (ref 70–99)
Potassium: 3.5 mmol/L (ref 3.5–5.1)
Sodium: 141 mmol/L (ref 135–145)

## 2021-07-16 LAB — TYPE AND SCREEN
ABO/RH(D): A POS
Antibody Screen: NEGATIVE

## 2021-07-28 NOTE — Anesthesia Preprocedure Evaluation (Signed)
Anesthesia Evaluation  Patient identified by MRN, date of birth, ID band Patient awake    Reviewed: Allergy & Precautions, NPO status , Patient's Chart, lab work & pertinent test results, reviewed documented beta blocker date and time   History of Anesthesia Complications Negative for: history of anesthetic complications  Airway Mallampati: II  TM Distance: >3 FB Neck ROM: Full    Dental  (+) Dental Advisory Given   Pulmonary neg pulmonary ROS,    Pulmonary exam normal        Cardiovascular hypertension, Pt. on medications and Pt. on home beta blockers Normal cardiovascular exam     Neuro/Psych negative neurological ROS  negative psych ROS   GI/Hepatic Neg liver ROS, GERD  Medicated and Controlled,  Endo/Other  negative endocrine ROS  Renal/GU negative Renal ROS     Musculoskeletal  (+) Arthritis ,   Abdominal   Peds  Hematology  (+) Blood dyscrasia, anemia ,   Anesthesia Other Findings   Reproductive/Obstetrics                            Anesthesia Physical Anesthesia Plan  ASA: 2  Anesthesia Plan: Spinal   Post-op Pain Management: Regional block* and Tylenol PO (pre-op)*   Induction:   PONV Risk Score and Plan: 2 and Treatment may vary due to age or medical condition and Propofol infusion  Airway Management Planned: Natural Airway and Simple Face Mask  Additional Equipment: None  Intra-op Plan:   Post-operative Plan:   Informed Consent: I have reviewed the patients History and Physical, chart, labs and discussed the procedure including the risks, benefits and alternatives for the proposed anesthesia with the patient or authorized representative who has indicated his/her understanding and acceptance.       Plan Discussed with: CRNA and Anesthesiologist  Anesthesia Plan Comments: (Labs reviewed, platelets acceptable. Discussed risks and benefits of spinal, including  spinal/epidural hematoma, infection, failed block, and PDPH. Patient expressed understanding and wished to proceed. )       Anesthesia Quick Evaluation

## 2021-07-28 NOTE — H&P (Signed)
TOTAL KNEE ADMISSION H&P  Patient is being admitted for left total knee arthroplasty.  Subjective:  Chief Complaint:left knee pain.  HPI: Rhonda Hart, 76 y.o. female, has a history of pain and functional disability in the left knee due to arthritis and has failed non-surgical conservative treatments for greater than 12 weeks to includeNSAID's and/or analgesics, corticosteriod injections, and activity modification.  Onset of symptoms was gradual, starting 8 years ago with gradually worsening course since that time. The patient noted no past surgery on the left knee(s).  Patient currently rates pain in the left knee(s) at 8 out of 10 with activity. Patient has worsening of pain with activity and weight bearing, pain that interferes with activities of daily living, and pain with passive range of motion.  Patient has evidence of joint space narrowing by imaging studies. There is no active infection.  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   Past Medical History:  Diagnosis Date   Arthritis    GERD (gastroesophageal reflux disease)    History of colon polyps    benign   Hyperlipidemia    has never been on meds   Hypertension    takes Hyzaar,HCTZ,and Metoprolol daily   Joint pain     Past Surgical History:  Procedure Laterality Date   ABDOMINAL HYSTERECTOMY     CESAREAN SECTION     colonosocpy     JOINT REPLACEMENT     TOTAL HIP ARTHROPLASTY     x 2 on right and x 1 on left   TOTAL HIP REVISION Left 11/30/2014   Procedure: TOTAL LEFT HIP REVISION;  Surgeon: Earlie Server, MD;  Location: [place];  Service: Orthopedics;  Laterality: Left;   TOTAL KNEE ARTHROPLASTY Right 04/19/2015   Procedure: RIGHT TOTAL KNEE ARTHROPLASTY;  Surgeon:  Earlie Server, MD;  Location: [place];  Service: Orthopedics;  Laterality: Right;   TOTAL SHOULDER ARTHROPLASTY Left     No current facility-administered medications for this encounter.   Current Outpatient Medications  Medication Sig Dispense Refill Last Dose   albuterol (PROVENTIL HFA;VENTOLIN HFA) 108 (90 Base) MCG/ACT inhaler Inhale 1-2 puffs into the lungs every 6 (six) hours as needed for up to 7 days for wheezing or shortness of breath. 1 Inhaler 0    amLODipine (NORVASC) 10 MG tablet Take 10 mg by mouth daily.      aspirin 81 MG tablet Take 81 mg by mouth daily.      citalopram (CELEXA) 10 MG tablet Take 10 mg by mouth daily as needed for depression.      diclofenac Sodium (VOLTAREN) 1 % GEL Apply 1 Application topically daily as needed for pain.      loratadine (CLARITIN) 10 MG tablet Take 10 mg by mouth daily as needed for allergies.      losartan-hydrochlorothiazide (HYZAAR) 100-25 MG tablet Take 1 tablet by mouth daily.      metoprolol succinate (TOPROL-XL) 50 MG 24 hr tablet Take 50 mg by mouth daily.      naproxen (NAPROSYN) 500 MG tablet Take 500 mg by mouth daily as needed for pain.      pantoprazole (PROTONIX) 40 MG tablet Take 40 mg by mouth daily.      Vitamin D3 (VITAMIN D)  25 MCG tablet Take 1,000 Units by mouth daily.      zinc gluconate 50 MG tablet Take 50 mg by mouth daily as needed (when feel sluggish).      Allergies  Allergen Reactions   Coumadin [Warfarin Sodium] Rash    Social History   Tobacco Use   Smoking status: Never   Smokeless tobacco: Never  Substance Use Topics   Alcohol use: No    No family history on file.   Review of Systems  Constitutional:  Negative for chills and fever.  Respiratory:  Negative for cough and shortness of breath.   Cardiovascular:  Negative for chest pain.  Gastrointestinal:  Negative for nausea and vomiting.  Musculoskeletal:  Positive for arthralgias.     Objective:  Physical Exam Well nourished and well  developed. General: Alert and oriented x3, cooperative and pleasant, no acute distress. Head: normocephalic, atraumatic, neck supple. Eyes: EOMI.  Musculoskeletal: Left knee exam: No palpable effusion, warmth or erythema Neutral to slight valgus with slight flexion contracture Tenderness over the lateral side of the knee Flexion with terminal tightness and discomfort anterior lateral to 120 degrees No significant lower extremity edema or erythema   Calves soft and nontender. Motor function intact in LE. Strength 5/5 LE bilaterally. Neuro: Distal pulses 2+. Sensation to light touch intact in LE.  Vital signs in last 24 hours:    Labs:   Estimated body mass index is 29.74 kg/m as calculated from the following:   Height as of 07/16/21: '5\' 1"'$  (1.549 m).   Weight as of 07/16/21: 71.4 kg.   Imaging Review Plain radiographs demonstrate severe degenerative joint disease of the left knee(s). The overall alignment isneutral. The bone quality appears to be adequate for age and reported activity level.      Assessment/Plan:  End stage arthritis, left knee   The patient history, physical examination, clinical judgment of the provider and imaging studies are consistent with end stage degenerative joint disease of the left knee(s) and total knee arthroplasty is deemed medically necessary. The treatment options including medical management, injection therapy arthroscopy and arthroplasty were discussed at length. The risks and benefits of total knee arthroplasty were presented and reviewed. The risks due to aseptic loosening, infection, stiffness, patella tracking problems, thromboembolic complications and other imponderables were discussed. The patient acknowledged the explanation, agreed to proceed with the plan and consent was signed. Patient is being admitted for inpatient treatment for surgery, pain control, PT, OT, prophylactic antibiotics, VTE prophylaxis, progressive ambulation and ADL's  and discharge planning. The patient is planning to be discharged  home.  Therapy Plans: outpatient therapy at [facility] Disposition: Home with son & husband Planned DVT Prophylaxis: aspirin '81mg'$  BID DME needed: walker PCP: Dr. Silas [place], clearance received TXA: IV Allergies: coumadin - hives Anesthesia Concerns: prefers general anesthesia BMI: 27.9 Last HgbA1c: Not diabetic   Other: - Oxycodone, tylenol, robaxin, celebrex   Patient's anticipated LOS is less than 2 midnights, meeting these requirements: - Younger than 42 - Lives within 1 hour of care - Has a competent adult at home to recover with post-op recover - NO history of  - Chronic pain requiring opiods  - Diabetes  - Coronary Artery Disease  - Heart failure  - Heart attack  - Stroke  - DVT/VTE  - Cardiac arrhythmia  - Respiratory Failure/COPD  - Renal failure  - Anemia  - Advanced Liver disease  Costella Hatcher, PA-C Orthopedic Surgery [facility] [place] [phone]

## 2021-07-29 ENCOUNTER — Ambulatory Visit (HOSPITAL_COMMUNITY): Payer: Medicare HMO | Admitting: Anesthesiology

## 2021-07-29 ENCOUNTER — Encounter (HOSPITAL_COMMUNITY)
Admission: RE | Disposition: A | Discharge status: Home / Self Care | Payer: Self-pay | Source: Ambulatory Visit | Attending: Orthopedic Surgery

## 2021-07-29 ENCOUNTER — Other Ambulatory Visit: Payer: Self-pay

## 2021-07-29 ENCOUNTER — Ambulatory Visit (HOSPITAL_BASED_OUTPATIENT_CLINIC_OR_DEPARTMENT_OTHER): Payer: Medicare HMO | Admitting: Anesthesiology

## 2021-07-29 ENCOUNTER — Encounter (HOSPITAL_COMMUNITY): Payer: Self-pay | Admitting: Orthopedic Surgery

## 2021-07-29 ENCOUNTER — Observation Stay (HOSPITAL_COMMUNITY)
Admission: RE | Admit: 2021-07-29 | Discharge: 2021-07-30 | Disposition: A | Discharge status: Home / Self Care | Payer: Medicare HMO | Source: Ambulatory Visit | Attending: Orthopedic Surgery | Admitting: Orthopedic Surgery

## 2021-07-29 DIAGNOSIS — I10 Essential (primary) hypertension: Secondary | ICD-10-CM | POA: Diagnosis not present

## 2021-07-29 DIAGNOSIS — Z7982 Long term (current) use of aspirin: Secondary | ICD-10-CM | POA: Diagnosis not present

## 2021-07-29 DIAGNOSIS — M1712 Unilateral primary osteoarthritis, left knee: Principal | ICD-10-CM | POA: Insufficient documentation

## 2021-07-29 DIAGNOSIS — Z96652 Presence of left artificial knee joint: Secondary | ICD-10-CM

## 2021-07-29 DIAGNOSIS — Z96612 Presence of left artificial shoulder joint: Secondary | ICD-10-CM | POA: Insufficient documentation

## 2021-07-29 DIAGNOSIS — Z96651 Presence of right artificial knee joint: Secondary | ICD-10-CM | POA: Insufficient documentation

## 2021-07-29 DIAGNOSIS — Z96643 Presence of artificial hip joint, bilateral: Secondary | ICD-10-CM | POA: Insufficient documentation

## 2021-07-29 DIAGNOSIS — Z79899 Other long term (current) drug therapy: Secondary | ICD-10-CM | POA: Diagnosis not present

## 2021-07-29 HISTORY — PX: TOTAL KNEE ARTHROPLASTY: SHX125

## 2021-07-29 SURGERY — ARTHROPLASTY, KNEE, TOTAL
Anesthesia: Spinal | Site: Knee | Laterality: Left

## 2021-07-29 MED ORDER — CITALOPRAM HYDROBROMIDE 20 MG PO TABS
10.0000 mg | ORAL_TABLET | Freq: Every day | ORAL | Status: DC
  Administered 2021-07-30: 10 mg via ORAL
  Filled 2021-07-29: qty 1

## 2021-07-29 MED ORDER — LOSARTAN POTASSIUM 50 MG PO TABS
100.0000 mg | ORAL_TABLET | Freq: Every day | ORAL | Status: DC
  Administered 2021-07-30: 100 mg via ORAL
  Filled 2021-07-29: qty 2

## 2021-07-29 MED ORDER — KETOROLAC TROMETHAMINE 30 MG/ML IJ SOLN
INTRAMUSCULAR | Status: DC | PRN
  Administered 2021-07-29: 30 mg

## 2021-07-29 MED ORDER — ORAL CARE MOUTH RINSE: 15.0000 mL | Freq: Once | OROMUCOSAL | Status: AC

## 2021-07-29 MED ORDER — LORATADINE 10 MG PO TABS
10.0000 mg | ORAL_TABLET | Freq: Every day | ORAL | Status: DC | PRN
Start: 2021-07-29 — End: 2021-07-30

## 2021-07-29 MED ORDER — CELECOXIB 200 MG PO CAPS
200.0000 mg | ORAL_CAPSULE | Freq: Two times a day (BID) | ORAL | Status: DC
Start: 2021-07-29 — End: 2021-07-30
  Administered 2021-07-29 – 2021-07-30 (×3): 200 mg via ORAL
  Filled 2021-07-29 (×3): qty 1

## 2021-07-29 MED ORDER — VANCOMYCIN HCL 1000 MG IV SOLR
INTRAVENOUS | Status: AC
  Filled 2021-07-29: qty 20

## 2021-07-29 MED ORDER — FENTANYL CITRATE PF 50 MCG/ML IJ SOSY: 25.0000 ug | PREFILLED_SYRINGE | INTRAMUSCULAR | Status: DC | PRN

## 2021-07-29 MED ORDER — METOCLOPRAMIDE HCL 5 MG PO TABS: 5.0000 mg | ORAL_TABLET | Freq: Three times a day (TID) | ORAL | Status: DC | PRN

## 2021-07-29 MED ORDER — METHOCARBAMOL 500 MG PO TABS
500.0000 mg | ORAL_TABLET | Freq: Four times a day (QID) | ORAL | Status: DC | PRN
  Administered 2021-07-29 – 2021-07-30 (×3): 500 mg via ORAL
  Filled 2021-07-29 (×3): qty 1

## 2021-07-29 MED ORDER — POVIDONE-IODINE 10 % EX SWAB
2.0000 | Freq: Once | CUTANEOUS | Status: AC
  Administered 2021-07-29: 2 via TOPICAL

## 2021-07-29 MED ORDER — CEFAZOLIN SODIUM-DEXTROSE 2-4 GM/100ML-% IV SOLN
2.0000 g | Freq: Four times a day (QID) | INTRAVENOUS | Status: AC
  Administered 2021-07-29 (×2): 2 g via INTRAVENOUS
  Filled 2021-07-29 (×2): qty 100

## 2021-07-29 MED ORDER — METOPROLOL SUCCINATE ER 50 MG PO TB24
50.0000 mg | ORAL_TABLET | Freq: Every day | ORAL | Status: DC
  Administered 2021-07-30: 50 mg via ORAL
  Filled 2021-07-29: qty 1

## 2021-07-29 MED ORDER — OXYCODONE HCL 5 MG/5ML PO SOLN: 5.0000 mg | Freq: Once | ORAL | Status: DC | PRN

## 2021-07-29 MED ORDER — DOCUSATE SODIUM 100 MG PO CAPS
100.0000 mg | ORAL_CAPSULE | Freq: Two times a day (BID) | ORAL | Status: DC
  Administered 2021-07-29 – 2021-07-30 (×2): 100 mg via ORAL
  Filled 2021-07-29 (×2): qty 1

## 2021-07-29 MED ORDER — FERROUS SULFATE 325 (65 FE) MG PO TABS
325.0000 mg | ORAL_TABLET | Freq: Three times a day (TID) | ORAL | Status: DC
  Administered 2021-07-30 (×2): 325 mg via ORAL
  Filled 2021-07-29 (×2): qty 1

## 2021-07-29 MED ORDER — ONDANSETRON HCL 4 MG/2ML IJ SOLN
INTRAMUSCULAR | Status: DC | PRN
  Administered 2021-07-29: 4 mg via INTRAVENOUS

## 2021-07-29 MED ORDER — DEXAMETHASONE SODIUM PHOSPHATE 10 MG/ML IJ SOLN
8.0000 mg | Freq: Once | INTRAMUSCULAR | Status: AC
  Administered 2021-07-29: 8 mg via INTRAVENOUS

## 2021-07-29 MED ORDER — KETOROLAC TROMETHAMINE 30 MG/ML IJ SOLN
INTRAMUSCULAR | Status: AC
  Filled 2021-07-29: qty 1

## 2021-07-29 MED ORDER — PANTOPRAZOLE SODIUM 40 MG PO TBEC
40.0000 mg | DELAYED_RELEASE_TABLET | Freq: Every day | ORAL | Status: DC
  Administered 2021-07-29 – 2021-07-30 (×2): 40 mg via ORAL
  Filled 2021-07-29 (×2): qty 1

## 2021-07-29 MED ORDER — BUPIVACAINE IN DEXTROSE 0.75-8.25 % IT SOLN
INTRATHECAL | Status: DC | PRN
  Administered 2021-07-29: 1.4 mL via INTRATHECAL

## 2021-07-29 MED ORDER — TRANEXAMIC ACID-NACL 1000-0.7 MG/100ML-% IV SOLN
1000.0000 mg | INTRAVENOUS | Status: AC
  Administered 2021-07-29: 1000 mg via INTRAVENOUS
  Filled 2021-07-29: qty 100

## 2021-07-29 MED ORDER — ALBUTEROL SULFATE HFA 108 (90 BASE) MCG/ACT IN AERS: 1.0000 | INHALATION_SPRAY | Freq: Four times a day (QID) | RESPIRATORY_TRACT | Status: DC | PRN

## 2021-07-29 MED ORDER — MENTHOL 3 MG MT LOZG: 1.0000 | LOZENGE | OROMUCOSAL | Status: DC | PRN

## 2021-07-29 MED ORDER — OXYCODONE HCL 5 MG PO TABS
5.0000 mg | ORAL_TABLET | ORAL | Status: DC | PRN
  Filled 2021-07-29: qty 2

## 2021-07-29 MED ORDER — FENTANYL CITRATE (PF) 100 MCG/2ML IJ SOLN
INTRAMUSCULAR | Status: DC | PRN
  Administered 2021-07-29: 100 ug via INTRAVENOUS

## 2021-07-29 MED ORDER — SODIUM CHLORIDE (PF) 0.9 % IJ SOLN
INTRAMUSCULAR | Status: DC | PRN
  Administered 2021-07-29: 30 mL

## 2021-07-29 MED ORDER — ACETAMINOPHEN 325 MG PO TABS: 325.0000 mg | ORAL_TABLET | Freq: Four times a day (QID) | ORAL | Status: DC | PRN

## 2021-07-29 MED ORDER — PROPOFOL 1000 MG/100ML IV EMUL
INTRAVENOUS | Status: AC
Start: 2021-07-29 — End: ?
  Filled 2021-07-29: qty 100

## 2021-07-29 MED ORDER — LOSARTAN POTASSIUM-HCTZ 100-25 MG PO TABS: 1.0000 | ORAL_TABLET | Freq: Every day | ORAL | Status: DC

## 2021-07-29 MED ORDER — TOBRAMYCIN SULFATE 1.2 G IJ SOLR
INTRAMUSCULAR | Status: AC
  Filled 2021-07-29: qty 1.2

## 2021-07-29 MED ORDER — OXYCODONE HCL 5 MG PO TABS: 5.0000 mg | ORAL_TABLET | Freq: Once | ORAL | Status: DC | PRN

## 2021-07-29 MED ORDER — TRANEXAMIC ACID-NACL 1000-0.7 MG/100ML-% IV SOLN
1000.0000 mg | Freq: Once | INTRAVENOUS | Status: AC
  Administered 2021-07-29: 1000 mg via INTRAVENOUS
  Filled 2021-07-29: qty 100

## 2021-07-29 MED ORDER — BUPIVACAINE-EPINEPHRINE (PF) 0.25% -1:200000 IJ SOLN
INTRAMUSCULAR | Status: AC
  Filled 2021-07-29: qty 30

## 2021-07-29 MED ORDER — ONDANSETRON HCL 4 MG PO TABS: 4.0000 mg | ORAL_TABLET | Freq: Four times a day (QID) | ORAL | Status: DC | PRN

## 2021-07-29 MED ORDER — BISACODYL 10 MG RE SUPP: 10.0000 mg | Freq: Every day | RECTAL | Status: DC | PRN

## 2021-07-29 MED ORDER — PROPOFOL 500 MG/50ML IV EMUL
INTRAVENOUS | Status: DC | PRN
  Administered 2021-07-29: 100 ug/kg/min via INTRAVENOUS

## 2021-07-29 MED ORDER — CHLORHEXIDINE GLUCONATE 0.12 % MT SOLN
15.0000 mL | Freq: Once | OROMUCOSAL | Status: AC
  Administered 2021-07-29: 15 mL via OROMUCOSAL

## 2021-07-29 MED ORDER — 0.9 % SODIUM CHLORIDE (POUR BTL) OPTIME
TOPICAL | Status: DC | PRN
  Administered 2021-07-29: 1000 mL

## 2021-07-29 MED ORDER — PHENOL 1.4 % MT LIQD
1.0000 | OROMUCOSAL | Status: DC | PRN
Start: 2021-07-29 — End: 2021-07-30

## 2021-07-29 MED ORDER — POLYETHYLENE GLYCOL 3350 17 G PO PACK: 17.0000 g | PACK | Freq: Every day | ORAL | Status: DC | PRN

## 2021-07-29 MED ORDER — SODIUM CHLORIDE 0.9 % IR SOLN
Status: DC | PRN
  Administered 2021-07-29: 1000 mL

## 2021-07-29 MED ORDER — PROPOFOL 10 MG/ML IV BOLUS
INTRAVENOUS | Status: DC | PRN
  Administered 2021-07-29 (×2): 20 mg via INTRAVENOUS

## 2021-07-29 MED ORDER — LACTATED RINGERS IV SOLN: INTRAVENOUS | Status: DC

## 2021-07-29 MED ORDER — METOCLOPRAMIDE HCL 5 MG/ML IJ SOLN: 5.0000 mg | Freq: Three times a day (TID) | INTRAMUSCULAR | Status: DC | PRN

## 2021-07-29 MED ORDER — STERILE WATER FOR IRRIGATION IR SOLN
Status: DC | PRN
  Administered 2021-07-29: 2000 mL

## 2021-07-29 MED ORDER — METHOCARBAMOL 1000 MG/10ML IJ SOLN: 500.0000 mg | Freq: Four times a day (QID) | INTRAVENOUS | Status: DC | PRN

## 2021-07-29 MED ORDER — AMLODIPINE BESYLATE 10 MG PO TABS
10.0000 mg | ORAL_TABLET | Freq: Every day | ORAL | Status: DC
  Administered 2021-07-30: 10 mg via ORAL
  Filled 2021-07-29: qty 1

## 2021-07-29 MED ORDER — SODIUM CHLORIDE (PF) 0.9 % IJ SOLN
INTRAMUSCULAR | Status: AC
  Filled 2021-07-29: qty 30

## 2021-07-29 MED ORDER — DEXAMETHASONE SODIUM PHOSPHATE 10 MG/ML IJ SOLN
10.0000 mg | Freq: Once | INTRAMUSCULAR | Status: AC
  Administered 2021-07-30: 10 mg via INTRAVENOUS
  Filled 2021-07-29: qty 1

## 2021-07-29 MED ORDER — PHENYLEPHRINE HCL-NACL 20-0.9 MG/250ML-% IV SOLN
INTRAVENOUS | Status: DC | PRN
  Administered 2021-07-29: 50 ug/min via INTRAVENOUS

## 2021-07-29 MED ORDER — HYDROMORPHONE HCL 1 MG/ML IJ SOLN
0.5000 mg | INTRAMUSCULAR | Status: DC | PRN
  Administered 2021-07-29 – 2021-07-30 (×2): 1 mg via INTRAVENOUS
  Filled 2021-07-29 (×2): qty 1

## 2021-07-29 MED ORDER — SODIUM CHLORIDE 0.9 % IV SOLN
INTRAVENOUS | Status: DC
Start: 2021-07-29 — End: 2021-07-30

## 2021-07-29 MED ORDER — FENTANYL CITRATE (PF) 100 MCG/2ML IJ SOLN
INTRAMUSCULAR | Status: AC
  Filled 2021-07-29: qty 2

## 2021-07-29 MED ORDER — OXYCODONE HCL 5 MG PO TABS
10.0000 mg | ORAL_TABLET | ORAL | Status: DC | PRN
  Administered 2021-07-29: 10 mg via ORAL
  Administered 2021-07-29: 15 mg via ORAL
  Administered 2021-07-29: 10 mg via ORAL
  Administered 2021-07-30 (×3): 15 mg via ORAL
  Filled 2021-07-29 (×3): qty 3
  Filled 2021-07-29: qty 2
  Filled 2021-07-29: qty 3

## 2021-07-29 MED ORDER — PHENYLEPHRINE HCL-NACL 20-0.9 MG/250ML-% IV SOLN
INTRAVENOUS | Status: AC
  Filled 2021-07-29: qty 250

## 2021-07-29 MED ORDER — CEFAZOLIN SODIUM-DEXTROSE 2-4 GM/100ML-% IV SOLN
2.0000 g | INTRAVENOUS | Status: AC
  Administered 2021-07-29: 2 g via INTRAVENOUS
  Filled 2021-07-29: qty 100

## 2021-07-29 MED ORDER — ROPIVACAINE HCL 7.5 MG/ML IJ SOLN
INTRAMUSCULAR | Status: DC | PRN
  Administered 2021-07-29: 20 mL via PERINEURAL

## 2021-07-29 MED ORDER — ACETAMINOPHEN 500 MG PO TABS
1000.0000 mg | ORAL_TABLET | Freq: Once | ORAL | Status: AC
  Administered 2021-07-29: 1000 mg via ORAL
  Filled 2021-07-29: qty 2

## 2021-07-29 MED ORDER — DIPHENHYDRAMINE HCL 12.5 MG/5ML PO ELIX: 12.5000 mg | ORAL_SOLUTION | ORAL | Status: DC | PRN

## 2021-07-29 MED ORDER — HYDROCHLOROTHIAZIDE 25 MG PO TABS
25.0000 mg | ORAL_TABLET | Freq: Every day | ORAL | Status: DC
  Administered 2021-07-30: 25 mg via ORAL
  Filled 2021-07-29: qty 1

## 2021-07-29 MED ORDER — ONDANSETRON HCL 4 MG/2ML IJ SOLN: 4.0000 mg | Freq: Four times a day (QID) | INTRAMUSCULAR | Status: DC | PRN

## 2021-07-29 MED ORDER — BUPIVACAINE-EPINEPHRINE (PF) 0.25% -1:200000 IJ SOLN
INTRAMUSCULAR | Status: DC | PRN
  Administered 2021-07-29: 30 mL

## 2021-07-29 MED ORDER — ONDANSETRON HCL 4 MG/2ML IJ SOLN: 4.0000 mg | Freq: Once | INTRAMUSCULAR | Status: DC | PRN

## 2021-07-29 MED ORDER — PROPOFOL 10 MG/ML IV BOLUS
INTRAVENOUS | Status: AC
  Filled 2021-07-29: qty 20

## 2021-07-29 MED ORDER — POVIDONE-IODINE 10 % EX SWAB: 2.0000 "application " | Freq: Once | CUTANEOUS | Status: DC

## 2021-07-29 MED ORDER — LIDOCAINE HCL (PF) 2 % IJ SOLN
INTRAMUSCULAR | Status: AC
  Filled 2021-07-29: qty 5

## 2021-07-29 MED ORDER — ASPIRIN 81 MG PO CHEW
81.0000 mg | CHEWABLE_TABLET | Freq: Two times a day (BID) | ORAL | Status: DC
  Administered 2021-07-29 – 2021-07-30 (×2): 81 mg via ORAL
  Filled 2021-07-29 (×2): qty 1

## 2021-07-29 SURGICAL SUPPLY — 57 items
ADH SKN CLS APL DERMABOND .7 (GAUZE/BANDAGES/DRESSINGS) ×1
ATTUNE MED ANAT PAT 35 KNEE (Knees) ×1 IMPLANT
ATTUNE PSFEM LTSZ4 NARCEM KNEE (Femur) ×1 IMPLANT
ATTUNE PSRP INSR SZ4 5 KNEE (Insert) ×1 IMPLANT
BAG COUNTER SPONGE SURGICOUNT (BAG) ×1 IMPLANT
BAG SPEC THK2 15X12 ZIP CLS (MISCELLANEOUS) ×1
BAG ZIPLOCK 12X15 (MISCELLANEOUS) ×1 IMPLANT
BASE TIBIAL ROT PLAT SZ 3 KNEE (Knees) IMPLANT
BLADE SAW SGTL 11.0X1.19X90.0M (BLADE) ×1 IMPLANT
BLADE SAW SGTL 13.0X1.19X90.0M (BLADE) ×2 IMPLANT
BNDG ELASTIC 6X5.8 VLCR STR LF (GAUZE/BANDAGES/DRESSINGS) ×2 IMPLANT
BOWL SMART MIX CTS (DISPOSABLE) ×2 IMPLANT
BSPLAT TIB 3 CMNT ROT PLAT STR (Knees) ×1 IMPLANT
CEMENT HV SMART SET (Cement) ×2 IMPLANT
CUFF TOURN SGL QUICK 34 (TOURNIQUET CUFF) ×2
CUFF TRNQT CYL 34X4.125X (TOURNIQUET CUFF) ×1 IMPLANT
DERMABOND ADVANCED (GAUZE/BANDAGES/DRESSINGS) ×1
DERMABOND ADVANCED .7 DNX12 (GAUZE/BANDAGES/DRESSINGS) ×1 IMPLANT
DRAPE SHEET LG 3/4 BI-LAMINATE (DRAPES) ×2 IMPLANT
DRAPE U-SHAPE 47X51 STRL (DRAPES) ×2 IMPLANT
DRESSING AQUACEL AG SP 3.5X10 (GAUZE/BANDAGES/DRESSINGS) ×1 IMPLANT
DRSG AQUACEL AG SP 3.5X10 (GAUZE/BANDAGES/DRESSINGS) ×2
DURAPREP 26ML APPLICATOR (WOUND CARE) ×4 IMPLANT
ELECT REM PT RETURN 15FT ADLT (MISCELLANEOUS) ×2 IMPLANT
GLOVE BIO SURGEON STRL SZ 6 (GLOVE) ×2 IMPLANT
GLOVE BIOGEL PI IND STRL 6.5 (GLOVE) ×1 IMPLANT
GLOVE BIOGEL PI IND STRL 7.5 (GLOVE) ×1 IMPLANT
GLOVE BIOGEL PI INDICATOR 6.5 (GLOVE) ×1
GLOVE BIOGEL PI INDICATOR 7.5 (GLOVE) ×1
GLOVE ORTHO TXT STRL SZ7.5 (GLOVE) ×5 IMPLANT
GOWN STRL REUS W/ TWL LRG LVL3 (GOWN DISPOSABLE) ×2 IMPLANT
GOWN STRL REUS W/TWL LRG LVL3 (GOWN DISPOSABLE) ×4
HANDPIECE INTERPULSE COAX TIP (DISPOSABLE) ×2
HOLDER FOLEY CATH W/STRAP (MISCELLANEOUS) ×1 IMPLANT
KIT TURNOVER KIT A (KITS) ×1 IMPLANT
MANIFOLD NEPTUNE II (INSTRUMENTS) ×2 IMPLANT
NDL SAFETY ECLIPSE 18X1.5 (NEEDLE) IMPLANT
NEEDLE HYPO 18GX1.5 SHARP (NEEDLE) ×2
NS IRRIG 1000ML POUR BTL (IV SOLUTION) ×2 IMPLANT
PACK TOTAL KNEE CUSTOM (KITS) ×2 IMPLANT
PROTECTOR NERVE ULNAR (MISCELLANEOUS) ×2 IMPLANT
SET HNDPC FAN SPRY TIP SCT (DISPOSABLE) ×1 IMPLANT
SET PAD KNEE POSITIONER (MISCELLANEOUS) ×2 IMPLANT
SPIKE FLUID TRANSFER (MISCELLANEOUS) ×4 IMPLANT
SUT MNCRL AB 4-0 PS2 18 (SUTURE) ×2 IMPLANT
SUT STRATAFIX PDS+ 0 24IN (SUTURE) ×2 IMPLANT
SUT VIC AB 1 CT1 36 (SUTURE) ×2 IMPLANT
SUT VIC AB 2-0 CT1 27 (SUTURE) ×4
SUT VIC AB 2-0 CT1 TAPERPNT 27 (SUTURE) ×2 IMPLANT
SYR 3ML LL SCALE MARK (SYRINGE) ×2 IMPLANT
TIBIAL BASE ROT PLAT SZ 3 KNEE (Knees) ×2 IMPLANT
TOWEL GREEN STERILE FF (TOWEL DISPOSABLE) ×2 IMPLANT
TRAY CATH INTERMITTENT SS 16FR (CATHETERS) ×2 IMPLANT
TRAY FOLEY MTR SLVR 16FR STAT (SET/KITS/TRAYS/PACK) ×2 IMPLANT
TUBE SUCTION HIGH CAP CLEAR NV (SUCTIONS) ×2 IMPLANT
WATER STERILE IRR 1000ML POUR (IV SOLUTION) ×4 IMPLANT
WRAP KNEE MAXI GEL POST OP (GAUZE/BANDAGES/DRESSINGS) ×2 IMPLANT

## 2021-07-29 NOTE — Care Plan (Signed)
Ortho Bundle Case Management Note  Patient Details  Name: ALYZAE HAWKEY MRN: [id number] Date of Birth: 1945-09-30  L TKA on 07-29-21 DCP:  Home with husband and son DME:  RW ordered through [facility] PT:  [place] on 08-01-21                   DME Arranged:  Gilford Rile rolling DME Agency:  [employer]  HH Arranged:  NA HH Agency:  NA  Additional Comments: Please contact me with any questions of if this plan should need to change.  Marianne Sofia, RN,CCM [facility]  [phone] 07/29/2021, 9:08 AM

## 2021-07-29 NOTE — Anesthesia Procedure Notes (Signed)
Spinal  Patient location during procedure: OR Start time: 07/29/2021 7:17 AM End time: 07/29/2021 7:20 AM Reason for block: surgical anesthesia Staffing Performed: anesthesiologist  Anesthesiologist: [name], MD Performed by: [name], MD Authorized by: [name], MD   Preanesthetic Checklist Completed: patient identified, IV checked, risks and benefits discussed, surgical consent, monitors and equipment checked, pre-op evaluation and timeout performed Spinal Block Patient position: sitting Prep: DuraPrep Patient monitoring: heart rate, cardiac monitor, continuous pulse ox and blood pressure Approach: midline Location: L3-4 Injection technique: single-shot Needle Needle type: Pencan  Needle gauge: 24 G Additional Notes Consent was obtained prior to the procedure with all questions answered and concerns addressed. Risks including, but not limited to, bleeding, infection, nerve damage, paralysis, failed block, inadequate analgesia, allergic reaction, high spinal, itching, and headache were discussed and the patient wished to proceed. Functioning IV was confirmed and monitors were applied. Sterile prep and drape, including hand hygiene, mask, and sterile gloves were used. The patient was positioned and the spine was prepped. The skin was anesthetized with lidocaine. Free flow of clear CSF was obtained prior to injecting local anesthetic into the CSF. The spinal needle aspirated freely following injection. The needle was carefully withdrawn. The patient tolerated the procedure well.   Renold Don, MD

## 2021-07-29 NOTE — Evaluation (Signed)
Physical Therapy Evaluation Patient Details Name: Rhonda Hart MRN: [id number] DOB: 03/02/1945 Today's Date: 07/29/2021  History of Present Illness  Pt is a 76yo female presenting s/p L-TKA on 07/29/21. PMH: GERD, HLD, HTN, L-THR 2016, R-TKA 2017, L-TSA.  Clinical Impression  Rhonda Hart is a 76 y.o. female POD 0 s/p L-TKA. Patient reports IND with mobility at baseline. Patient is now limited by functional impairments (see PT problem list below) and requires min assist for bed mobility and min guard for transfers; further mobility deferred secondary to limited SLR and pt reporting "12/10" pain. Patient instructed in exercise to facilitate ROM and circulation to manage edema. Provided incentive spirometer and with Vcs pt able to achieve 1046m. Patient will benefit from continued skilled PT interventions to address impairments and progress towards PLOF. Acute PT will follow to progress mobility and stair training in preparation for safe discharge home.       Recommendations for follow up therapy are one component of a multi-disciplinary discharge planning process, led by the attending physician.  Recommendations may be updated based on patient status, additional functional criteria and insurance authorization.  Follow Up Recommendations Follow physician's recommendations for discharge plan and follow up therapies      Assistance Recommended at Discharge Set up Supervision/Assistance  Patient can return home with the following  A little help with walking and/or transfers;A little help with bathing/dressing/bathroom;Assistance with cooking/housework;Assist for transportation;Help with stairs or ramp for entrance    Equipment Recommendations Rolling walker (2 wheels)  Recommendations for Other Services       Functional Status Assessment Patient has had a recent decline in their functional status and demonstrates the ability to make significant improvements in function in a reasonable  and predictable amount of time.     Precautions / Restrictions Precautions Precautions: Fall Restrictions Weight Bearing Restrictions: No Other Position/Activity Restrictions: WBAT      Mobility  Bed Mobility Overal bed mobility: Needs Assistance Bed Mobility: Supine to Sit     Supine to sit: Min assist     General bed mobility comments: Min assist to bring LLE off bed    Transfers Overall transfer level: Needs assistance Equipment used: Rolling walker (2 wheels) Transfers: Sit to/from Stand, Bed to chair/wheelchair/BSC Sit to Stand: Min guard, From elevated surface   Step pivot transfers: Min guard       General transfer comment: for safety only from elevated surface    Ambulation/Gait               General Gait Details: deferred  Stairs            Wheelchair Mobility    Modified Rankin (Stroke Patients Only)       Balance Overall balance assessment: Needs assistance Sitting-balance support: Feet supported, No upper extremity supported Sitting balance-Leahy Scale: Good     Standing balance support: Reliant on assistive device for balance, During functional activity, Bilateral upper extremity supported Standing balance-Leahy Scale: Poor                               Pertinent Vitals/Pain Pain Assessment Pain Assessment: 0-10 Pain Score: 10-Worst pain ever Pain Location: left knee Pain Descriptors / Indicators: Operative site guarding Pain Intervention(s): Limited activity within patient's tolerance, Monitored during session, Repositioned, Ice applied    Home Living Family/patient expects to be discharged to:: Private residence Living Arrangements: Spouse/significant other;Children Available Help at Discharge: Family;Available 24 hours/day Type  of Home: House Home Access: Level entry       Home Layout: One level Home Equipment: None      Prior Function Prior Level of Function : Independent/Modified Independent              Mobility Comments: ind ADLs Comments: ind     Hand Dominance   Dominant Hand: Right    Extremity/Trunk Assessment   Upper Extremity Assessment Upper Extremity Assessment: Overall WFL for tasks assessed    Lower Extremity Assessment Lower Extremity Assessment: RLE deficits/detail;LLE deficits/detail RLE Deficits / Details: MMT ank PF/DF 4/5 RLE Sensation: WNL LLE Deficits / Details: MMT ank PF/DF 4/5, pt unable to perform SLR greater than ~2" LLE Sensation: WNL    Cervical / Trunk Assessment Cervical / Trunk Assessment: Kyphotic  Communication   Communication: No difficulties  Cognition Arousal/Alertness: Awake/alert Behavior During Therapy: WFL for tasks assessed/performed Overall Cognitive Status: Within Functional Limits for tasks assessed                                          General Comments General comments (skin integrity, edema, etc.): Son Rhonda Hart present    Exercises Total Joint Exercises Ankle Circles/Pumps: AROM, Both, 10 reps   Assessment/Plan    PT Assessment Patient needs continued PT services  PT Problem List Decreased strength;Decreased range of motion;Decreased activity tolerance;Decreased balance;Decreased mobility;Decreased coordination;Decreased knowledge of use of DME;Pain       PT Treatment Interventions DME instruction;Gait training;Functional mobility training;Therapeutic activities;Therapeutic exercise;Balance training;Neuromuscular re-education;Patient/family education    PT Goals (Current goals can be found in the Care Plan section)  Acute Rehab PT Goals Patient Stated Goal: get back to normal routine PT Goal Formulation: With patient Time For Goal Achievement: 08/05/21 Potential to Achieve Goals: Good    Frequency 7X/week     Co-evaluation               AM-PAC PT "6 Clicks" Mobility  Outcome Measure Help needed turning from your back to your side while in a flat bed without using bedrails?:  None Help needed moving from lying on your back to sitting on the side of a flat bed without using bedrails?: A Little Help needed moving to and from a bed to a chair (including a wheelchair)?: A Little Help needed standing up from a chair using your arms (e.g., wheelchair or bedside chair)?: A Little Help needed to walk in hospital room?: A Little Help needed climbing 3-5 steps with a railing? : A Lot 6 Click Score: 18    End of Session Equipment Utilized During Treatment: Gait belt Activity Tolerance: Patient limited by pain Patient left: in chair;with call bell/phone within reach;with chair alarm set;with family/visitor present Nurse Communication: Mobility status;Patient requests pain meds PT Visit Diagnosis: Difficulty in walking, not elsewhere classified (R26.2);Pain Pain - Right/Left: Left Pain - part of body: Knee    Time: 6606-3016 PT Time Calculation (min) (ACUTE ONLY): 22 min   Charges:   PT Evaluation $PT Eval Low Complexity: 1 Low          Coolidge Breeze, PT, DPT [facility] Department Office: [phone] Pager: [phone]  Coolidge Breeze 07/29/2021, 6:52 PM

## 2021-07-29 NOTE — Progress Notes (Signed)
PT Cancellation Note  Patient Details Name: Rhonda Hart MRN: [id number] DOB: Feb 15, 1945   Cancelled Treatment:    Reason Eval/Treat Not Completed: Pain limiting ability to participate, Pain medication not due for 1 hour. Will check back as schedule allows.    [name] 07/29/2021, 4:06 PM [place] Office [phone] Weekend [phone]

## 2021-07-29 NOTE — Discharge Instructions (Signed)

## 2021-07-29 NOTE — Anesthesia Postprocedure Evaluation (Signed)
Anesthesia Post Note  Patient: Rhonda Hart  Procedure(s) Performed: TOTAL KNEE ARTHROPLASTY (Left: Knee)     Patient location during evaluation: PACU Anesthesia Type: Spinal Level of consciousness: awake and alert Pain management: pain level controlled Vital Signs Assessment: post-procedure vital signs reviewed and stable Respiratory status: spontaneous breathing and respiratory function stable Cardiovascular status: blood pressure returned to baseline and stable Postop Assessment: spinal receding and no apparent nausea or vomiting Anesthetic complications: no   No notable events documented.  Last Vitals:  Vitals:   07/29/21 1100 07/29/21 1141  BP: 118/60 135/77  Pulse: (!) 49 (!) 54  Resp: 13 16  Temp:  36.4 C  SpO2: 100% 100%    Last Pain:  Vitals:   07/29/21 1141  TempSrc: Oral  PainSc:                  [name]

## 2021-07-29 NOTE — Transfer of Care (Signed)
Immediate Anesthesia Transfer of Care Note  Patient: Rhonda Hart  Procedure(s) Performed: TOTAL KNEE ARTHROPLASTY (Left: Knee)  Patient Location: PACU  Anesthesia Type:Regional and Spinal  Level of Consciousness: sedated  Airway & Oxygen Therapy: Patient Spontanous Breathing and Patient connected to face mask oxygen  Post-op Assessment: Report given to RN and Post -op Vital signs reviewed and stable  Post vital signs: Reviewed and stable  Last Vitals:  Vitals Value Taken Time  BP 109/56 07/29/21 0910  Temp    Pulse 54 07/29/21 0911  Resp 18 07/29/21 0911  SpO2 100 % 07/29/21 0911  Vitals shown include unvalidated device data.  Last Pain:  Vitals:   07/29/21 0544  TempSrc: Oral  PainSc:       Patients Stated Pain Goal: 6 (11/12/26 1188)  Complications: No notable events documented.

## 2021-07-29 NOTE — Op Note (Signed)
NAME:  Rhonda Hart RECORD NO.:  [id number]                             FACILITY:  [facility]      PHYSICIAN:  Pietro Cassis. Alvan Dame, M.D.  DATE OF BIRTH:  1945/10/07      DATE OF PROCEDURE:  07/29/2021                                     OPERATIVE REPORT         PREOPERATIVE DIAGNOSIS:  Left knee osteoarthritis.      POSTOPERATIVE DIAGNOSIS:  Left knee osteoarthritis.      FINDINGS:  The patient was noted to have complete loss of cartilage and   bone-on-bone arthritis with associated osteophytes in the lateral and patellofemoral compartments of   the knee with a significant synovitis and associated effusion.  The patient had failed months of conservative treatment including medications, injection therapy, activity modification.     PROCEDURE:  Left total knee replacement.      COMPONENTS USED:  DePuy Attune rotating platform posterior stabilized knee   system, a size 4N femur, 3 tibia, size 5 mm PS AOX insert, and 35 anatomic patellar   button.      SURGEON:  Pietro Cassis. Alvan Dame, M.D.      ASSISTANT:  Costella Hatcher, PA-C.      ANESTHESIA:  Regional and Spinal.      SPECIMENS:  None.      COMPLICATION:  None.      DRAINS:  None.  EBL: <100 cc      TOURNIQUET TIME:   Total Tourniquet Time Documented: Thigh (Left) - 35 minutes Total: Thigh (Left) - 35 minutes  .      The patient was stable to the recovery room.      INDICATION FOR PROCEDURE:  Rhonda Hart is a 76 y.o. female patient of   mine.  The patient had been seen, evaluated, and treated for months conservatively in the   office with medication, activity modification, and injections.  The patient had   radiographic changes of bone-on-bone arthritis with endplate sclerosis and osteophytes noted.  Based on the radiographic changes and failed conservative measures, the patient   decided to proceed with definitive treatment, total knee replacement.  Risks of infection, DVT, component  failure, need for revision surgery, neurovascular injury were reviewed in the office setting.  The postop course was reviewed stressing the efforts to maximize post-operative satisfaction and function.  Consent was obtained for benefit of pain   relief.      PROCEDURE IN DETAIL:  The patient was brought to the operative theater.   Once adequate anesthesia, preoperative antibiotics, 2 gm of Ancef,1 gm of Tranexamic Acid, and 10 mg of Decadron administered, the patient was positioned supine with a left thigh tourniquet placed.  The  left lower extremity was prepped and draped in sterile fashion.  A time-   out was performed identifying the patient, planned procedure, and the appropriate extremity.      The left lower extremity was placed in the [facility] leg holder.  The leg was   exsanguinated, tourniquet elevated to 250 mmHg.  A midline incision was  made followed by median parapatellar arthrotomy.  Following initial   exposure, attention was first directed to the patella.  Precut   measurement was noted to be 23 mm.  I resected down to 14 mm and used a   35 anatomic patellar button to restore patellar height as well as cover the cut surface.      The lug holes were drilled and a metal shim was placed to protect the   patella from retractors and saw blade during the procedure.      At this point, attention was now directed to the femur.  The femoral   canal was opened with a drill, irrigated to try to prevent fat emboli.  An   intramedullary rod was passed at 3 degrees valgus, 9 mm of bone was   resected off the distal femur.  Following this resection, the tibia was   subluxated anteriorly.  Using the extramedullary guide, 4 mm of bone was resected off   the proximal lateral tibia.  We confirmed the gap would be   stable medially and laterally with a size 5 spacer block as well as confirmed that the tibial cut was perpendicular in the coronal plane, checking with an alignment rod.      Once  this was done, I sized the femur to be a size 4 in the anterior-   posterior dimension, chose a narrow component based on medial and   lateral dimension.  The size 4 rotation block was then pinned in   position anterior referenced using the C-clamp to set rotation.  The   anterior, posterior, and  chamfer cuts were made without difficulty nor   notching making certain that I was along the anterior cortex to help   with flexion gap stability.      The final box cut was made off the lateral aspect of distal femur.      At this point, the tibia was sized to be a size 3.  The size 3 tray was   then pinned in position through the medial third of the tubercle,   drilled, and keel punched.  Trial reduction was now carried with a 4 femur,  3 tibia, a size 5 mm PS insert, and the 35 anatomic patella botton.  The knee was brought to full extension with good flexion stability with the patella   tracking through the trochlea without application of pressure.  Given   all these findings the trial components removed.  Final components were   opened and cement was mixed.  The knee was irrigated with normal saline solution and pulse lavage.  The synovial lining was   then injected with 30 cc of 0.25% Marcaine with epinephrine, 1 cc of Toradol and 30 cc of NS for a total of 61 cc.     Final implants were then cemented onto cleaned and dried cut surfaces of bone with the knee brought to extension with a size 5 mm PS trial insert.      Once the cement had fully cured, excess cement was removed   throughout the knee.  I confirmed that I was satisfied with the range of   motion and stability, and the final size 5 mm PS AOX insert was chosen.  It was   placed into the knee.      The tourniquet had been let down at 35 minutes.  No significant   hemostasis was required.  The extensor mechanism was then reapproximated using #1 Vicryl  and #1 Stratafix sutures with the knee   in flexion.  The   remaining wound was  closed with 2-0 Vicryl and running 4-0 Monocryl.   The knee was cleaned, dried, dressed sterilely using Dermabond and   Aquacel dressing.  The patient was then   brought to recovery room in stable condition, tolerating the procedure   well.   Please note that Physician Assistant, Costella Hatcher, PA-C was present for the entirety of the case, and was utilized for pre-operative positioning, peri-operative retractor management, general facilitation of the procedure and for primary wound closure at the end of the case.              Pietro Cassis Alvan Dame, M.D.    07/29/2021 8:54 AM

## 2021-07-29 NOTE — Anesthesia Procedure Notes (Signed)
Anesthesia Regional Block: Adductor canal block   Pre-Anesthetic Checklist: , timeout performed,  Correct Patient, Correct Site, Correct Laterality,  Correct Procedure, Correct Position, site marked,  Risks and benefits discussed,  Surgical consent,  Pre-op evaluation,  At surgeon's request and post-op pain management  Laterality: Left  Prep: chloraprep       Needles:  Injection technique: Single-shot  Needle Type: Echogenic Needle     Needle Length: 10cm  Needle Gauge: 21     Additional Needles:   Narrative:  Start time: 07/29/2021 7:01 AM End time: 07/29/2021 7:04 AM Injection made incrementally with aspirations every 5 mL.  Performed by: Personally  Anesthesiologist: [name], MD  Additional Notes: No pain on injection. No increased resistance to injection. Injection made in 5cc increments. Good needle visualization. Patient tolerated the procedure well.

## 2021-07-29 NOTE — Interval H&P Note (Signed)
History and Physical Interval Note:  07/29/2021 7:02 AM  Rhonda Hart  has presented today for surgery, with the diagnosis of Left knee osteoarthritis.  The various methods of treatment have been discussed with the patient and family. After consideration of risks, benefits and other options for treatment, the patient has consented to  Procedure(s): TOTAL KNEE ARTHROPLASTY (Left) as a surgical intervention.  The patient's history has been reviewed, patient examined, no change in status, stable for surgery.  I have reviewed the patient's chart and labs.  Questions were answered to the patient's satisfaction.     [name]

## 2021-07-30 ENCOUNTER — Encounter (HOSPITAL_COMMUNITY): Payer: Self-pay | Admitting: Orthopedic Surgery

## 2021-07-30 DIAGNOSIS — M1712 Unilateral primary osteoarthritis, left knee: Secondary | ICD-10-CM | POA: Diagnosis not present

## 2021-07-30 LAB — BASIC METABOLIC PANEL
Anion gap: 7 (ref 5–15)
BUN: 26 mg/dL — ABNORMAL HIGH (ref 8–23)
CO2: 22 mmol/L (ref 22–32)
Calcium: 8.5 mg/dL — ABNORMAL LOW (ref 8.9–10.3)
Chloride: 112 mmol/L — ABNORMAL HIGH (ref 98–111)
Creatinine, Ser: 0.86 mg/dL (ref 0.44–1.00)
GFR, Estimated: >60 mL/min (ref >60)
Glucose, Bld: 118 mg/dL — ABNORMAL HIGH (ref 70–99)
Potassium: 3.9 mmol/L (ref 3.5–5.1)
Sodium: 141 mmol/L (ref 135–145)

## 2021-07-30 LAB — CBC
HCT: 32.3 % — ABNORMAL LOW (ref 36.0–46.0)
Hemoglobin: 10.3 g/dL — ABNORMAL LOW (ref 12.0–15.0)
MCH: 30.1 pg (ref 26.0–34.0)
MCHC: 31.9 g/dL (ref 30.0–36.0)
MCV: 94.4 fL (ref 80.0–100.0)
Platelets: 280 10*3/uL (ref 150–400)
RBC: 3.42 MIL/uL — ABNORMAL LOW (ref 3.87–5.11)
RDW: 13 % (ref 11.5–15.5)
WBC: 13.6 10*3/uL — ABNORMAL HIGH (ref 4.0–10.5)
nRBC: 0 % (ref 0.0–0.2)

## 2021-07-30 MED ORDER — OXYCODONE HCL 5 MG PO TABS
5.0000 mg | ORAL_TABLET | ORAL | 0 refills | Status: AC | PRN
Start: 2021-07-30 — End: ?

## 2021-07-30 MED ORDER — ASPIRIN 81 MG PO CHEW: 81.0000 mg | CHEWABLE_TABLET | Freq: Two times a day (BID) | ORAL | 0 refills | Status: AC

## 2021-07-30 MED ORDER — DOCUSATE SODIUM 100 MG PO CAPS: 100.0000 mg | ORAL_CAPSULE | Freq: Two times a day (BID) | ORAL | 0 refills | Status: AC

## 2021-07-30 MED ORDER — CELECOXIB 200 MG PO CAPS: 200.0000 mg | ORAL_CAPSULE | Freq: Two times a day (BID) | ORAL | 0 refills | Status: AC

## 2021-07-30 MED ORDER — METHOCARBAMOL 500 MG PO TABS: 500.0000 mg | ORAL_TABLET | Freq: Four times a day (QID) | ORAL | 0 refills | Status: AC | PRN

## 2021-07-30 MED ORDER — POLYETHYLENE GLYCOL 3350 17 G PO PACK: 17.0000 g | PACK | Freq: Every day | ORAL | 0 refills | Status: AC | PRN

## 2021-07-30 MED ORDER — ACETAMINOPHEN 325 MG PO TABS: 1000.0000 mg | ORAL_TABLET | Freq: Four times a day (QID) | ORAL | Status: AC

## 2021-07-30 NOTE — Plan of Care (Signed)

## 2021-07-30 NOTE — Progress Notes (Signed)
Pt has cleared PT and is ready for discharge.  PIV removed.  Pt is alert and oriented with family at bedside.  AVS given.  Pt able to verbalize understanding of all discharge instructions, including wound care and medication management.  All questions answered.

## 2021-07-30 NOTE — Plan of Care (Signed)
  Problem: Education: Goal: Knowledge of General Education information will improve Description: Including pain rating scale, medication(s)/side effects and non-pharmacologic comfort measures Outcome: Adequate for Discharge   

## 2021-07-30 NOTE — Progress Notes (Signed)
   Subjective: 1 Day Post-Op Procedure(s) (LRB): TOTAL KNEE ARTHROPLASTY (Left) Patient reports pain as mild.   Patient seen in rounds with Dr. Alvan Dame. Patient is well, and has had no acute complaints or problems. No acute events overnight. Foley catheter removed. Patient ambulated a few feet with PT.  We will start therapy today.   Objective: Vital signs in last 24 hours: Temp:  [97.4 F (36.3 C)-98.5 F (36.9 C)] 98 F (36.7 C) (07/19 0630) Pulse Rate:  [47-59] 59 (07/19 0826) Resp:  [13-20] 18 (07/19 0630) BP: (109-155)/(53-85) 145/85 (07/19 0826) SpO2:  [96 %-100 %] 100 % (07/19 0630)  Intake/Output from previous day:  Intake/Output Summary (Last 24 hours) at 07/30/2021 0832 Last data filed at 07/30/2021 0630 Gross per 24 hour  Intake 1828.52 ml  Output 1200 ml  Net 628.52 ml     Intake/Output this shift: No intake/output data recorded.  Labs: Recent Labs    07/30/21 0341  HGB 10.3*   Recent Labs    07/30/21 0341  WBC 13.6*  RBC 3.42*  HCT 32.3*  PLT 280   Recent Labs    07/30/21 0341  NA 141  K 3.9  CL 112*  CO2 22  BUN 26*  CREATININE 0.86  GLUCOSE 118*  CALCIUM 8.5*   No results for input(s): "LABPT", "INR" in the last 72 hours.  Exam: General - Patient is Alert and Oriented Extremity - Neurologically intact Sensation intact distally Intact pulses distally Dorsiflexion/Plantar flexion intact Dressing - dressing C/D/I Motor Function - intact, moving foot and toes well on exam.   Past Medical History:  Diagnosis Date   Arthritis    GERD (gastroesophageal reflux disease)    History of colon polyps    benign   Hyperlipidemia    has never been on meds   Hypertension    takes Hyzaar,HCTZ,and Metoprolol daily   Joint pain     Assessment/Plan: 1 Day Post-Op Procedure(s) (LRB): TOTAL KNEE ARTHROPLASTY (Left) Principal Problem:   S/P total knee arthroplasty, left  Estimated body mass index is 29.74 kg/m as calculated from the  following:   Height as of this encounter: '5\' 1"'$  (1.549 m).   Weight as of this encounter: 71.4 kg. Advance diet Up with therapy D/C IV fluids   Patient's anticipated LOS is less than 2 midnights, meeting these requirements: - Younger than 72 - Lives within 1 hour of care - Has a competent adult at home to recover with post-op recover - NO history of  - Chronic pain requiring opiods  - Diabetes  - Coronary Artery Disease  - Heart failure  - Heart attack  - Stroke  - DVT/VTE  - Cardiac arrhythmia  - Respiratory Failure/COPD  - Renal failure  - Anemia  - Advanced Liver disease     DVT Prophylaxis - Aspirin Weight bearing as tolerated.  Hgb stable at 10.3 this AM.  Plan is to go Home after hospital stay. Plan for discharge today following 1-2 sessions of PT as long as they are meeting their goals. Patient is scheduled for OPPT. Follow up in the office in 2 weeks.   Griffith Citron, PA-C Orthopedic Surgery [phone] 07/30/2021, 8:32 AM

## 2021-07-30 NOTE — TOC Transition Note (Signed)
Transition of Care [facility]) - CM/SW Discharge Note  Patient Details  Name: Rhonda Hart MRN: [id number] Date of Birth: 03-15-1945  [facility] CM/SW Contact:  Sherie Don, LCSW Phone Number: 07/30/2021, 10:07 AM  Clinical Narrative: Patient is expected to discharge home after working with PT. CSW met with patient to confirm discharge plan and needs. Patient will go home with OPPT at [facility]. Patient will need a youth rolling walker, which was delivered to her room by [employer]. TOC signing off.  Final next level of care: OP Rehab Barriers to Discharge: No Barriers Identified  Patient Goals and CMS Choice Patient states their goals for this hospitalization and ongoing recovery are:: Discharge home with OPPT at [place] Medicare.gov Compare Post Acute Care list provided to:: Patient Choice offered to / list presented to : Patient  Discharge Plan and Services         DME Arranged: [place] Rile youth DME Agency: [employer] Representative spoke with at DME Agency: Prearranged in orthopedist's office HH Arranged: NA [place] Agency: NA  Readmission Risk Interventions     No data to display

## 2021-07-30 NOTE — Therapy (Signed)
OUTPATIENT PHYSICAL THERAPY LOWER EXTREMITY EVALUATION   Patient Name: Rhonda Hart MRN: [id number] DOB:1945-12-03, 76 y.o., female Today's Date: 08/01/2021   PT End of Session - 08/01/21 1047     Visit Number 1    Number of Visits 17    Date for PT Re-Evaluation 09/27/21    Authorization Type [employer]- requesting auth    PT Start Time 1050    PT Stop Time 1140   10 minutes game ready   PT Time Calculation (min) 50 min    Equipment Utilized During Treatment Other (comment)   RW   Activity Tolerance Patient tolerated treatment well    Behavior During Therapy WFL for tasks assessed/performed             Past Medical History:  Diagnosis Date   Arthritis    GERD (gastroesophageal reflux disease)    History of colon polyps    benign   Hyperlipidemia    has never been on meds   Hypertension    takes Hyzaar,HCTZ,and Metoprolol daily   Joint pain    Past Surgical History:  Procedure Laterality Date   ABDOMINAL HYSTERECTOMY     CESAREAN SECTION     colonosocpy     JOINT REPLACEMENT     TOTAL HIP ARTHROPLASTY     x 2 on right and x 1 on left   TOTAL HIP REVISION Left 11/30/2014   Procedure: TOTAL LEFT HIP REVISION;  Surgeon: Earlie Server, MD;  Location: [place];  Service: Orthopedics;  Laterality: Left;   TOTAL KNEE ARTHROPLASTY Right 04/19/2015   Procedure: RIGHT TOTAL KNEE ARTHROPLASTY;  Surgeon: Earlie Server, MD;  Location: [place];  Service: Orthopedics;  Laterality: Right;   TOTAL KNEE ARTHROPLASTY Left 07/29/2021   Procedure: TOTAL KNEE ARTHROPLASTY;  Surgeon: Paralee Cancel, MD;  Location: [facility];  Service: Orthopedics;  Laterality: Left;   TOTAL SHOULDER ARTHROPLASTY Left    Patient Active Problem List   Diagnosis Date Noted   S/P total knee arthroplasty, left 07/29/2021   UTI (urinary tract infection) 04/21/2015   Primary localized osteoarthritis of right knee 04/19/2015   Hypertension    Hyperlipidemia    S/P revision of total hip 12/01/2014   Status  post revision of total hip replacement 11/30/2014    PCP: Antony Blackbird, MD  REFERRING PROVIDER: Paralee Cancel, MD  REFERRING DIAG: [phone] (ICD-10-CM) - Presence of left artificial knee joint  THERAPY DIAG:  Acute pain of left knee  Stiffness of left knee, not elsewhere classified  Muscle weakness (generalized)  Other abnormalities of gait and mobility  Localized edema  Rationale for Evaluation and Treatment Rehabilitation  ONSET DATE: 07/29/21  SUBJECTIVE:   SUBJECTIVE STATEMENT: Patient had Lt TKA on 07/29/21. She reports the knee is still pretty painful making it difficult to rest. She has been walking in the hallway with her RW and has been completing her HEP that was prescribed by acute care PT. She has f/u with surgeon on 08/17/21. Prior to surgery she did not use an AD for ambulation.   PERTINENT HISTORY: Rt TKA 2017  Bilateral THA x 2 on RLE and x 1 on LLE  PAIN:  Are you having pain? Yes: NPRS scale: 8/10 Pain location: Lt anterior knee Pain description: sharp Aggravating factors: laying down Relieving factors: nothing  PRECAUTIONS: Fall  WEIGHT BEARING RESTRICTIONS No  FALLS:  Has patient fallen in last 6 months? No  LIVING ENVIRONMENT: Lives with: lives with their family Lives in: House/apartment Stairs: Yes:  External: 2 steps; none Has following equipment at home: Walker - 2 wheeled  OCCUPATION: retired   PLOF: Independent;   PATIENT GOALS "I want to get back to normal."    OBJECTIVE:   DIAGNOSTIC FINDINGS: None on file   PATIENT SURVEYS:  FOTO 63% function   COGNITION:  Overall cognitive status: Within functional limits for tasks assessed     SENSATION: Not tested  EDEMA:  Swelling noted about the Lt knee  MUSCLE LENGTH: N/A  POSTURE: rounded shoulders and forward head  INSPECTION: Unable to inspect knee due to clothing  LOWER EXTREMITY ROM:  Active ROM Right eval Left eval  Hip flexion    Hip extension    Hip  abduction    Hip adduction    Hip internal rotation    Hip external rotation    Knee flexion 120 79  Knee extension WNL Lacking 10   Ankle dorsiflexion    Ankle plantarflexion    Ankle inversion    Ankle eversion     (Blank rows = not tested)  LOWER EXTREMITY MMT:  MMT Right eval Left eval  Hip flexion 4 3-  Hip extension    Hip abduction    Hip adduction    Hip internal rotation    Hip external rotation    Knee flexion 4+ Deferred due to recent post-operative status   Knee extension 4+ Deferred due to recent post-operative status  Ankle dorsiflexion    Ankle plantarflexion    Ankle inversion    Ankle eversion     (Blank rows = not tested)  LOWER EXTREMITY SPECIAL TESTS:  N/A  FUNCTIONAL TESTS:  TUG: 56 seconds  GAIT: Distance walked: 20 ft Assistive device utilized: Walker - 2 wheeled Level of assistance: Modified independence Comments: step to pattern; foot flat initial contact; limited knee flexion/extension throughout gait cycle; slow gait speed.     TODAY'S TREATMENT: [facility] Adult PT Treatment:                                                DATE: 08/01/21 Therapeutic Exercise: Demonstrated and issue initial HEP.   Therapeutic Activity: Education on assessment findings that will be addressed throughout duration of POC.   Modalities: Game ready x 10 minutes to Lt knee 34 degrees, low compression, legs elevated on wedge   PATIENT EDUCATION:  Education details: see treatment; education on modalities for pain control, signs and symptoms of DVT Person educated: Patient Education method: Explanation, Demonstration, Tactile cues, Verbal cues, and Handouts Education comprehension: verbalized understanding, returned demonstration, verbal cues required, tactile cues required, and needs further education   HOME EXERCISE PROGRAM: [email or web address]  ASSESSMENT:  CLINICAL IMPRESSION: Patient is a 76 y.o. female who was seen today for physical therapy evaluation and  treatment for s/p Lt TKA on 07/29/21.  She demonstrates LLE weakness, Lt knee ROM deficits, balance impairments, and gait abnormalities that are consistent with her recent post-operative status. She will benefit from skilled PT to address the above stated deficits in order to return to optimal function.    OBJECTIVE IMPAIRMENTS Abnormal gait, decreased activity tolerance, decreased balance, decreased endurance, decreased mobility, difficulty walking, decreased ROM, increased edema, improper body mechanics, postural dysfunction, and pain.   ACTIVITY LIMITATIONS carrying, lifting, bending, sitting, standing, squatting, sleeping, stairs, toileting, dressing, and locomotion level  PARTICIPATION LIMITATIONS: meal prep, cleaning, laundry, driving,  shopping, community activity, and yard work  PERSONAL FACTORS Age, Fitness, and 3+ comorbidities: Rt THA, Lt THA, Rt TKA  are also affecting patient's functional outcome.   REHAB POTENTIAL: Excellent  CLINICAL DECISION MAKING: Stable/uncomplicated  EVALUATION COMPLEXITY: Low   GOALS:   SHORT TERM GOALS: Target date: 08/29/2021  Patient will be independent and compliant with initial HEP.   Baseline: issued at eval  Goal status: INITIAL  2.  Patient will demonstrate at least 100 degrees Lt knee flexion AROM to improve ability to complete sit <> stand transfer.  Baseline: see above  Goal status: INITIAL  3.  Patient will demonstrate neutral Lt knee extension AROM to improve gait mechanics.  Baseline: see above  Goal status: INITIAL  4.  Patient will demonstrate at least 3+/5 Lt hip flexor strength to improve independence with bed mobility.  Baseline: 3-/5; needs assistance with lifting the LLE to transfer from sit to supine.  Goal status: INITIAL   LONG TERM GOALS: Target date: 09/26/2021   Patient will demonstrate at least 4/5 Lt knee strength to improve stability with stair/curb negotiation.  Baseline:  Goal status: INITIAL  2.  Patient  will complete TUG in </= 30 seconds to reduce fall risk.  Baseline: see above  Goal status: INITIAL  3.  Patient will score at least 81% on FOTO to signify clinically meaningful improvement in functional abilities.   Baseline: see above Goal status: INITIAL  4.  Patient will ambulate community distances without assistive device to improve her ability to grocery shop and attend community activities.  Baseline: RW;reports walking up/down her hallway in her home currently.  Goal status: INITIAL     PLAN: PT FREQUENCY: 2x/week  PT DURATION: 8 weeks  PLANNED INTERVENTIONS: Therapeutic exercises, Therapeutic activity, Neuromuscular re-education, Balance training, Gait training, Patient/Family education, Self Care, Joint mobilization, Stair training, Dry Needling, Cryotherapy, Moist heat, Vasopneumatic device, Manual therapy, and Re-evaluation  PLAN FOR NEXT SESSION: review and progress HEP prn; Lt knee ROM, gait training, quad strengthening   Dahlila Grant, PT, DPT, ATC 08/01/21 11:56 AM  Referring diagnosis?  Presence of left artificial knee joint  Treatment diagnosis? (if different than referring diagnosis)  Acute pain of left knee  Stiffness of left knee, not elsewhere classified  Muscle weakness (generalized)  Other abnormalities of gait and mobility  Localized Edema  What was this (referring dx) caused by? '[x]'$  Surgery '[]'$  Fall '[]'$  Ongoing issue '[]'$  Arthritis '[]'$  Other: ____________  Laterality: '[]'$  Rt '[x]'$  Lt '[]'$  Both  Check all possible CPT codes:  *CHOOSE 10 OR LESS*    '[x]'$  97110 (Therapeutic Exercise)  '[]'$  92507 (SLP Treatment)  '[x]'$  97112 (Neuro Re-ed)   '[]'$  92526 (Swallowing Treatment)   '[x]'$  53664 (Gait Training)   '[]'$  [id number] (Cognitive Training, 1st 15 minutes) '[x]'$  97140 (Manual Therapy)   '[]'$  97130 (Cognitive Training, each add'l 15 minutes)  '[x]'$  97164 (Re-evaluation)                              '[]'$  Other, List CPT Code ____________  '[x]'$  40347 (Therapeutic  Activities)     '[x]'$  97535 (Self Care)   '[x]'$  All codes above (97110 - 97535)  '[]'$  97012 (Mechanical Traction)  '[]'$  97014 (E-stim Unattended)  '[]'$  97032 (E-stim manual)  '[]'$  97033 (Ionto)  '[]'$  97035 (Ultrasound) '[]'$  97750 (Physical Performance Training) '[]'$  [id number] (Aquatic Therapy) '[x]'$  97016 (Vasopneumatic Device) '[]'$  [id number] (Paraffin) '[]'$  97034 (Contrast Bath) '[]'$  [id number] (Wound  Care 1st 20 sq cm) '[]'$  97598 (Wound Care each add'l 20 sq cm) '[]'$  97760 (Orthotic Fabrication, Fitting, Training Initial) '[]'$  [id number] (Prosthetic Management and Training Initial) '[]'$  [phone] (Orthotic or Prosthetic Training/ Modification Subsequent)

## 2021-08-01 ENCOUNTER — Ambulatory Visit: Payer: Medicare HMO | Attending: Orthopedic Surgery

## 2021-08-01 DIAGNOSIS — R600 Localized edema: Secondary | ICD-10-CM | POA: Diagnosis present

## 2021-08-01 DIAGNOSIS — M6281 Muscle weakness (generalized): Secondary | ICD-10-CM | POA: Insufficient documentation

## 2021-08-01 DIAGNOSIS — M25662 Stiffness of left knee, not elsewhere classified: Secondary | ICD-10-CM | POA: Diagnosis present

## 2021-08-01 DIAGNOSIS — R2689 Other abnormalities of gait and mobility: Secondary | ICD-10-CM | POA: Diagnosis present

## 2021-08-01 DIAGNOSIS — M25562 Pain in left knee: Secondary | ICD-10-CM | POA: Diagnosis not present

## 2021-08-07 ENCOUNTER — Ambulatory Visit: Payer: Medicare HMO

## 2021-08-11 ENCOUNTER — Encounter: Payer: Self-pay | Admitting: Physical Therapy

## 2021-08-11 ENCOUNTER — Ambulatory Visit: Payer: Medicare HMO | Admitting: Physical Therapy

## 2021-08-11 DIAGNOSIS — M6281 Muscle weakness (generalized): Secondary | ICD-10-CM

## 2021-08-11 DIAGNOSIS — M25662 Stiffness of left knee, not elsewhere classified: Secondary | ICD-10-CM

## 2021-08-11 DIAGNOSIS — M25562 Pain in left knee: Secondary | ICD-10-CM | POA: Diagnosis not present

## 2021-08-11 NOTE — Therapy (Signed)
OUTPATIENT PHYSICAL THERAPY TREATMENT NOTE   Patient Name: Rhonda Hart MRN: [id number] DOB:03-12-1945, 76 y.o., female Today's Date: 08/11/2021  PCP: Antony Blackbird, MD   REFERRING PROVIDER: Paralee Cancel, MD  END OF SESSION:   PT End of Session - 08/11/21 0936     Visit Number 2    Number of Visits 17    Date for PT Re-Evaluation 09/27/21    Authorization Type [employer]- requesting auth    Authorization Time Period 08/02/21-09/13/21    Authorization - Visit Number 2    Authorization - Number of Visits 12    Progress Note Due on Visit 10    PT Start Time 0932    PT Stop Time 1015    PT Time Calculation (min) 43 min             Past Medical History:  Diagnosis Date   Arthritis    GERD (gastroesophageal reflux disease)    History of colon polyps    benign   Hyperlipidemia    has never been on meds   Hypertension    takes Hyzaar,HCTZ,and Metoprolol daily   Joint pain    Past Surgical History:  Procedure Laterality Date   ABDOMINAL HYSTERECTOMY     CESAREAN SECTION     colonosocpy     JOINT REPLACEMENT     TOTAL HIP ARTHROPLASTY     x 2 on right and x 1 on left   TOTAL HIP REVISION Left 11/30/2014   Procedure: TOTAL LEFT HIP REVISION;  Surgeon: Earlie Server, MD;  Location: [place];  Service: Orthopedics;  Laterality: Left;   TOTAL KNEE ARTHROPLASTY Right 04/19/2015   Procedure: RIGHT TOTAL KNEE ARTHROPLASTY;  Surgeon: Earlie Server, MD;  Location: [place];  Service: Orthopedics;  Laterality: Right;   TOTAL KNEE ARTHROPLASTY Left 07/29/2021   Procedure: TOTAL KNEE ARTHROPLASTY;  Surgeon: Paralee Cancel, MD;  Location: [facility];  Service: Orthopedics;  Laterality: Left;   TOTAL SHOULDER ARTHROPLASTY Left    Patient Active Problem List   Diagnosis Date Noted   S/P total knee arthroplasty, left 07/29/2021   UTI (urinary tract infection) 04/21/2015   Primary localized osteoarthritis of right knee 04/19/2015   Hypertension    Hyperlipidemia    S/P revision of total  hip 12/01/2014   Status post revision of total hip replacement 11/30/2014    REFERRING DIAG: L46.503 (ICD-10-CM) - Presence of left artificial knee joint   THERAPY DIAG:  Acute pain of left knee  Stiffness of left knee, not elsewhere classified  Muscle weakness (generalized)  Rationale for Evaluation and Treatment Rehabilitation  PERTINENT HISTORY: Rt TKA 2017  Bilateral THA x 2 on RLE and x 1 on LLE  PRECAUTIONS:  Fall   SUBJECTIVE: My knee feels like pins are sticking in it.   PAIN:  Are you having pain? Yes: NPRS scale: 8/10 Pain location: Lt anterior knee Pain description: sharp, pins and needles  Aggravating factors: laying down Relieving factors: nothing   OBJECTIVE: (objective measures completed at initial evaluation unless otherwise dated)   DIAGNOSTIC FINDINGS: None on file    PATIENT SURVEYS:  FOTO 63% function    COGNITION:           Overall cognitive status: Within functional limits for tasks assessed                          SENSATION: Not tested   EDEMA:  Swelling noted about the Lt knee  MUSCLE LENGTH: N/A   POSTURE: rounded shoulders and forward head   INSPECTION: Unable to inspect knee due to clothing   LOWER EXTREMITY ROM:   Active ROM Right eval Left eval  Hip flexion      Hip extension      Hip abduction      Hip adduction      Hip internal rotation      Hip external rotation      Knee flexion 120 79  Knee extension WNL Lacking 10   Ankle dorsiflexion      Ankle plantarflexion      Ankle inversion      Ankle eversion       (Blank rows = not tested)   LOWER EXTREMITY MMT:   MMT Right eval Left eval  Hip flexion 4 3-  Hip extension      Hip abduction      Hip adduction      Hip internal rotation      Hip external rotation      Knee flexion 4+ Deferred due to recent post-operative status   Knee extension 4+ Deferred due to recent post-operative status  Ankle dorsiflexion      Ankle plantarflexion      Ankle  inversion      Ankle eversion       (Blank rows = not tested)   LOWER EXTREMITY SPECIAL TESTS:  N/A   FUNCTIONAL TESTS:  TUG: 56 seconds   GAIT: Distance walked: 20 ft Assistive device utilized: Walker - 2 wheeled Level of assistance: Modified independence Comments: step to pattern; foot flat initial contact; limited knee flexion/extension throughout gait cycle; slow gait speed.        TODAY'S TREATMENT: [facility] Adult PT Treatment:                                                DATE: 08/11/21 Therapeutic Exercise: Seated heel slides on cloth- pt tends to IR hip and invert ankle, needs tactile and verbal cues to correct Seated hamstring stretch with strap assist to maintain neutral alignment and assist with DF, 45 sec x 3  Supine heel slide - 78 AAROM with strap  Supine QS into towel -(has not performed at home) 5 sec 10 x 2  Seated AAROM knee extension 5 x 2 - cues for active DF Gait with RW with cues for heel strike and step length   [facility] Adult PT Treatment:                                                DATE: 08/01/21 Therapeutic Exercise: Demonstrated and issue initial HEP.    Therapeutic Activity: Education on assessment findings that will be addressed throughout duration of POC.    Modalities: Game ready x 10 minutes to Lt knee 34 degrees, low compression, legs elevated on wedge    PATIENT EDUCATION:  Education details: see treatment; education on modalities for pain control, signs and symptoms of DVT Person educated: Patient Education method: Explanation, Demonstration, Tactile cues, Verbal cues, and Handouts Education comprehension: verbalized understanding, returned demonstration, verbal cues required, tactile cues required, and needs further education     HOME EXERCISE PROGRAM: Access Code: [email or web address] URL: [email or web address] Date:  08/11/2021 Prepared by: Hessie Diener  Exercises - Supine Quad Set  - 2 x daily - 7 x weekly - 2 sets - 10 reps - 10  hold - Supine Heel Slide  - 2 x daily - 7 x weekly - 2 sets - 10 reps - Seated Hamstring Stretch  - 2 x daily - 7 x weekly - 1 sets - 3 reps - 45 hold - Seated Knee Extension AAROM  - 2 x daily - 7 x weekly - 2 sets - 10 reps - Side to Side Weight Shift with Unilateral Counter Support  - 2 x daily - 7 x weekly - 2 sets - 10 reps   ASSESSMENT:   CLINICAL IMPRESSION: Patient is a 76 y.o. female who was seen today for physical therapy treatment for s/p Lt TKA on 07/29/21.  Time spent with review of HEP. Significant cueing required in supine and sitting to maintain neutral alignment of LE joints with HEP. AROM 18-80 in sitting. Pt has MD f/u this week. She was encouraged to continued with her current HEP and to add seated ankle ROM and heel slides (no paper copies given today). She will benefit from skilled PT to address the above stated deficits in order to return to optimal function.      OBJECTIVE IMPAIRMENTS Abnormal gait, decreased activity tolerance, decreased balance, decreased endurance, decreased mobility, difficulty walking, decreased ROM, increased edema, improper body mechanics, postural dysfunction, and pain.    ACTIVITY LIMITATIONS carrying, lifting, bending, sitting, standing, squatting, sleeping, stairs, toileting, dressing, and locomotion level   PARTICIPATION LIMITATIONS: meal prep, cleaning, laundry, driving, shopping, community activity, and yard work   PERSONAL FACTORS Age, Fitness, and 3+ comorbidities: Rt THA, Lt THA, Rt TKA  are also affecting patient's functional outcome.    REHAB POTENTIAL: Excellent   CLINICAL DECISION MAKING: Stable/uncomplicated   EVALUATION COMPLEXITY: Low     GOALS:     SHORT TERM GOALS: Target date: 08/29/2021  Patient will be independent and compliant with initial HEP.    Baseline: issued at eval  Goal status: INITIAL   2.  Patient will demonstrate at least 100 degrees Lt knee flexion AROM to improve ability to complete sit <> stand  transfer.  Baseline: see above  Goal status: INITIAL   3.  Patient will demonstrate neutral Lt knee extension AROM to improve gait mechanics.  Baseline: see above  Goal status: INITIAL   4.  Patient will demonstrate at least 3+/5 Lt hip flexor strength to improve independence with bed mobility.  Baseline: 3-/5; needs assistance with lifting the LLE to transfer from sit to supine.  Goal status: INITIAL     LONG TERM GOALS: Target date: 09/26/2021    Patient will demonstrate at least 4/5 Lt knee strength to improve stability with stair/curb negotiation.  Baseline:  Goal status: INITIAL   2.  Patient will complete TUG in </= 30 seconds to reduce fall risk.  Baseline: see above  Goal status: INITIAL   3.  Patient will score at least 81% on FOTO to signify clinically meaningful improvement in functional abilities.   Baseline: see above Goal status: INITIAL   4.  Patient will ambulate community distances without assistive device to improve her ability to grocery shop and attend community activities.  Baseline: RW;reports walking up/down her hallway in her home currently.  Goal status: INITIAL         PLAN: PT FREQUENCY: 2x/week   PT DURATION: 8 weeks   PLANNED INTERVENTIONS: Therapeutic  exercises, Therapeutic activity, Neuromuscular re-education, Balance training, Gait training, Patient/Family education, Self Care, Joint mobilization, Stair training, Dry Needling, Cryotherapy, Moist heat, Vasopneumatic device, Manual therapy, and Re-evaluation   PLAN FOR NEXT SESSION:  How was MD appt? review and progress HEP prn; Lt knee ROM, gait training, quad strengthening      Hessie Diener, PTA 08/11/21 10:26 AM Phone: [phone] Fax: [phone]

## 2021-08-13 ENCOUNTER — Ambulatory Visit: Payer: Medicare HMO | Admitting: Physical Therapy

## 2021-08-13 NOTE — Discharge Summary (Signed)
Patient ID: Rhonda Hart MRN: [id number] DOB/AGE: 20-Jan-1945 76 y.o.  Admit date: 07/29/2021 Discharge date: 07/30/2021  Admission Diagnoses:  Left knee osteoarthritis  Discharge Diagnoses:  Principal Problem:   S/P total knee arthroplasty, left   Past Medical History:  Diagnosis Date   Arthritis    GERD (gastroesophageal reflux disease)    History of colon polyps    benign   Hyperlipidemia    has never been on meds   Hypertension    takes Hyzaar,HCTZ,and Metoprolol daily   Joint pain     Surgeries: Procedure(s): TOTAL KNEE ARTHROPLASTY on 07/29/2021   Consultants:   Discharged Condition: Improved  Hospital Course: Rhonda Hart is an 76 y.o. female who was admitted 07/29/2021 for operative treatment ofS/P total knee arthroplasty, left. Patient has severe unremitting pain that affects sleep, daily activities, and work/hobbies. After pre-op clearance the patient was taken to the operating room on 07/29/2021 and underwent  Procedure(s): TOTAL KNEE ARTHROPLASTY.    Patient was given perioperative antibiotics:  Anti-infectives (From admission, onward)    Start     Dose/Rate Route Frequency Ordered Stop   07/29/21 1400  ceFAZolin (ANCEF) IVPB 2g/100 mL premix        2 g 200 mL/hr over 30 Minutes Intravenous Every 6 hours 07/29/21 1138 07/30/21 1257   07/29/21 0600  ceFAZolin (ANCEF) IVPB 2g/100 mL premix        2 g 200 mL/hr over 30 Minutes Intravenous On call to O.R. 07/29/21 3220 07/29/21 2542        Patient was given sequential compression devices, early ambulation, and chemoprophylaxis to prevent DVT. Patient worked with PT and was meeting their goals regarding safe ambulation and transfers.  Patient benefited maximally from hospital stay and there were no complications.    Recent vital signs: No data found.   Recent laboratory studies: No results for input(s): "WBC", "HGB", "HCT", "PLT", "NA", "K", "CL", "CO2", "BUN", "CREATININE", "GLUCOSE", "INR",  "CALCIUM" in the last 72 hours.  Invalid input(s): "PT", "2"   Discharge Medications:   Allergies as of 07/30/2021       Reactions   Coumadin [warfarin Sodium] Rash        Medication List     STOP taking these medications    aspirin 81 MG tablet Replaced by: aspirin 81 MG chewable tablet   naproxen 500 MG tablet Commonly known as: NAPROSYN       TAKE these medications    acetaminophen 325 MG tablet Commonly known as: TYLENOL Take 3 tablets (975 mg total) by mouth every 6 (six) hours.   albuterol 108 (90 Base) MCG/ACT inhaler Commonly known as: VENTOLIN HFA Inhale 1-2 puffs into the lungs every 6 (six) hours as needed for up to 7 days for wheezing or shortness of breath.   amLODipine 10 MG tablet Commonly known as: NORVASC Take 10 mg by mouth daily.   aspirin 81 MG chewable tablet Chew 1 tablet (81 mg total) by mouth 2 (two) times daily for 28 days. Replaces: aspirin 81 MG tablet   celecoxib 200 MG capsule Commonly known as: CELEBREX Take 1 capsule (200 mg total) by mouth 2 (two) times daily.   citalopram 10 MG tablet Commonly known as: CELEXA Take 10 mg by mouth daily as needed for depression.   diclofenac Sodium 1 % Gel Commonly known as: VOLTAREN Apply 1 Application topically daily as needed for pain.   docusate sodium 100 MG capsule Commonly known as: COLACE Take 1 capsule (100 mg  total) by mouth 2 (two) times daily.   loratadine 10 MG tablet Commonly known as: CLARITIN Take 10 mg by mouth daily as needed for allergies.   losartan-hydrochlorothiazide 100-25 MG tablet Commonly known as: HYZAAR Take 1 tablet by mouth daily.   methocarbamol 500 MG tablet Commonly known as: ROBAXIN Take 1 tablet (500 mg total) by mouth every 6 (six) hours as needed for muscle spasms.   metoprolol succinate 50 MG 24 hr tablet Commonly known as: TOPROL-XL Take 50 mg by mouth daily.   oxyCODONE 5 MG immediate release tablet Commonly known as: Oxy  IR/ROXICODONE Take 1 tablet (5 mg total) by mouth every 4 (four) hours as needed for severe pain (pain score 4-6).   pantoprazole 40 MG tablet Commonly known as: PROTONIX Take 40 mg by mouth daily.   polyethylene glycol 17 g packet Commonly known as: MIRALAX / GLYCOLAX Take 17 g by mouth daily as needed for mild constipation.   vitamin D3 25 MCG tablet Commonly known as: CHOLECALCIFEROL Take 1,000 Units by mouth daily.   zinc gluconate 50 MG tablet Take 50 mg by mouth daily as needed (when feel sluggish).               Discharge Care Instructions  (From admission, onward)           Start     Ordered   07/30/21 0000  Change dressing       Comments: Maintain surgical dressing until follow up in the clinic. If the edges start to pull up, may reinforce with tape. If the dressing is no longer working, may remove and cover with gauze and tape, but must keep the area dry and clean.  Call with any questions or concerns.   07/30/21 0838            Diagnostic Studies: No results found.  Disposition: Discharge disposition: 01-Home or Self Care       Discharge Instructions     Call MD / Call 911   Complete by: As directed    If you experience chest pain or shortness of breath, CALL 911 and be transported to the hospital emergency room.  If you develope a fever above 101 F, pus (white drainage) or increased drainage or redness at the wound, or calf pain, call your surgeon's office.   Change dressing   Complete by: As directed    Maintain surgical dressing until follow up in the clinic. If the edges start to pull up, may reinforce with tape. If the dressing is no longer working, may remove and cover with gauze and tape, but must keep the area dry and clean.  Call with any questions or concerns.   Constipation Prevention   Complete by: As directed    Drink plenty of fluids.  Prune juice may be helpful.  You may use a stool softener, such as Colace (over the counter)  100 mg twice a day.  Use MiraLax (over the counter) for constipation as needed.   Diet - low sodium heart healthy   Complete by: As directed    Increase activity slowly as tolerated   Complete by: As directed    Weight bearing as tolerated with assist device (walker, cane, etc) as directed, use it as long as suggested by your surgeon or therapist, typically at least 4-6 weeks.   Post-operative opioid taper instructions:   Complete by: As directed    POST-OPERATIVE OPIOID TAPER INSTRUCTIONS: It is important to wean off of your  opioid medication as soon as possible. If you do not need pain medication after your surgery it is ok to stop day one. Opioids include: Codeine, Hydrocodone(Norco, Vicodin), Oxycodone(Percocet, oxycontin) and hydromorphone amongst others.  Long term and even short term use of opiods can cause: Increased pain response Dependence Constipation Depression Respiratory depression And more.  Withdrawal symptoms can include Flu like symptoms Nausea, vomiting And more Techniques to manage these symptoms Hydrate well Eat regular healthy meals Stay active Use relaxation techniques(deep breathing, meditating, yoga) Do Not substitute Alcohol to help with tapering If you have been on opioids for less than two weeks and do not have pain than it is ok to stop all together.  Plan to wean off of opioids This plan should start within one week post op of your joint replacement. Maintain the same interval or time between taking each dose and first decrease the dose.  Cut the total daily intake of opioids by one tablet each day Next start to increase the time between doses. The last dose that should be eliminated is the evening dose.      TED hose   Complete by: As directed    Use stockings (TED hose) for 2 weeks on both leg(s).  You may remove them at night for sleeping.        Follow-up Information     Irving Copas, PA-C. Go on 08/13/2021.   Specialty: Orthopedic  Surgery Why: You are scheduled for a follow up appointment on 08-13-21 at 10:15 am. Contact information: [address] [place] [address] [phone]                  Signed: Irving Copas 08/13/2021, 12:56 PM

## 2021-08-18 ENCOUNTER — Encounter: Payer: Self-pay | Admitting: Physical Therapy

## 2021-08-18 ENCOUNTER — Ambulatory Visit: Payer: Medicare HMO | Attending: Orthopedic Surgery | Admitting: Physical Therapy

## 2021-08-18 ENCOUNTER — Other Ambulatory Visit: Payer: Self-pay

## 2021-08-18 DIAGNOSIS — M6281 Muscle weakness (generalized): Secondary | ICD-10-CM | POA: Insufficient documentation

## 2021-08-18 DIAGNOSIS — M25662 Stiffness of left knee, not elsewhere classified: Secondary | ICD-10-CM | POA: Diagnosis present

## 2021-08-18 DIAGNOSIS — M25562 Pain in left knee: Secondary | ICD-10-CM | POA: Diagnosis present

## 2021-08-18 DIAGNOSIS — R2689 Other abnormalities of gait and mobility: Secondary | ICD-10-CM | POA: Insufficient documentation

## 2021-08-18 DIAGNOSIS — R600 Localized edema: Secondary | ICD-10-CM | POA: Insufficient documentation

## 2021-08-18 NOTE — Therapy (Signed)
OUTPATIENT PHYSICAL THERAPY TREATMENT NOTE   Patient Name: Rhonda Hart MRN: [id number] DOB:13-Jan-1945, 76 y.o., female Today's Date: 08/18/2021  PCP: Antony Blackbird, MD   REFERRING PROVIDER: Paralee Cancel, MD  END OF SESSION:   PT End of Session - 08/18/21 0956     Visit Number 3    Number of Visits 17    Date for PT Re-Evaluation 09/27/21    Authorization Type [employer]    Authorization Time Period 08/02/21 - 09/13/21    Authorization - Visit Number 2    Authorization - Number of Visits 12    Progress Note Due on Visit 10    PT Start Time 7628    PT Stop Time 1045    PT Time Calculation (min) 43 min    Activity Tolerance Patient tolerated treatment well    Behavior During Therapy WFL for tasks assessed/performed              Past Medical History:  Diagnosis Date   Arthritis    GERD (gastroesophageal reflux disease)    History of colon polyps    benign   Hyperlipidemia    has never been on meds   Hypertension    takes Hyzaar,HCTZ,and Metoprolol daily   Joint pain    Past Surgical History:  Procedure Laterality Date   ABDOMINAL HYSTERECTOMY     CESAREAN SECTION     colonosocpy     JOINT REPLACEMENT     TOTAL HIP ARTHROPLASTY     x 2 on right and x 1 on left   TOTAL HIP REVISION Left 11/30/2014   Procedure: TOTAL LEFT HIP REVISION;  Surgeon: Earlie Server, MD;  Location: [facility];  Service: Orthopedics;  Laterality: Left;   TOTAL KNEE ARTHROPLASTY Right 04/19/2015   Procedure: RIGHT TOTAL KNEE ARTHROPLASTY;  Surgeon: Earlie Server, MD;  Location: [facility];  Service: Orthopedics;  Laterality: Right;   TOTAL KNEE ARTHROPLASTY Left 07/29/2021   Procedure: TOTAL KNEE ARTHROPLASTY;  Surgeon: Paralee Cancel, MD;  Location: [facility];  Service: Orthopedics;  Laterality: Left;   TOTAL SHOULDER ARTHROPLASTY Left    Patient Active Problem List   Diagnosis Date Noted   S/P total knee arthroplasty, left 07/29/2021   UTI (urinary tract infection) 04/21/2015   Primary  localized osteoarthritis of right knee 04/19/2015   Hypertension    Hyperlipidemia    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   PAIN:  Are you having pain? Yes:  NPRS scale: 7/10 Pain location: Left knee Pain description: sharp, needles  Aggravating factors: laying down Relieving factors: nothing   OBJECTIVE: (objective measures completed at initial evaluation unless otherwise dated) PATIENT SURVEYS:  FOTO 63% function    EDEMA:  Swelling noted about the Lt knee   MUSCLE LENGTH: N/A   POSTURE: rounded shoulders and forward head   LOWER EXTREMITY ROM:   Active ROM Right eval Left eval Left 08/18/2021  Hip flexion  Hip extension       Hip abduction       Hip adduction       Hip internal rotation       Hip external rotation       Knee flexion 120 79 95  Knee extension WNL Lacking 10  Lacking 4  Ankle dorsiflexion       Ankle plantarflexion       Ankle inversion       Ankle eversion        (Blank rows = not tested)   LOWER EXTREMITY MMT:   MMT Right eval Left eval  Hip flexion 4 3-  Hip extension      Hip abduction      Hip adduction      Hip internal rotation      Hip external rotation      Knee flexion 4+ Deferred due to recent post-operative status   Knee extension 4+ Deferred due to recent post-operative status  Ankle dorsiflexion      Ankle plantarflexion      Ankle inversion      Ankle eversion        (Blank rows = not tested)   LOWER EXTREMITY SPECIAL TESTS:  N/A   FUNCTIONAL TESTS:  TUG: 56 seconds   GAIT: Distance walked: 20 ft Assistive device utilized: Walker - 2 wheeled Level of assistance: Modified independence Comments: step to pattern; foot flat initial contact; limited knee flexion/extension throughout gait cycle; slow gait speed.        TODAY'S TREATMENT: [facility] Adult PT Treatment:                                                DATE: 08/18/21 Therapeutic Exercise: NuStep L4 x 5 min with UE/LE while taking subjective Seated heel slides AROM 2 x 5 with 5 sec hold Quad set 10 x 5 sec with small towel roll SLR 2 x 10 Sidelying hip abduction 2 x 10 Seated knee AROM 2 x 10 Manual: Seated and supine tib-fem mobs to improve knee motion Patellar mobs all directions to improve knee motion Knee PROM flexion/extension   [facility] Adult PT Treatment:                                                DATE: 08/11/21 Therapeutic Exercise: Seated heel slides on cloth- pt tends to IR hip and invert ankle, needs tactile and verbal cues to correct Seated hamstring stretch with strap assist to maintain neutral alignment and assist with DF, 45 sec x 3  Supine heel slide - 78 AAROM with strap  Supine QS into towel -(has not performed at home) 5 sec 10 x 2  Seated AAROM knee extension 5 x 2 - cues for active DF Gait with RW with cues for heel strike and step length    PATIENT EDUCATION:  Education details: HEP update Person educated: Patient Education method: Explanation, Demonstration, Tactile cues, Verbal cues, and Handouts Education comprehension: verbalized understanding, returned demonstration, verbal cues required, tactile cues required, and needs further education   HOME EXERCISE PROGRAM: Access Code: [email or web address]    ASSESSMENT: CLINICAL IMPRESSION: Patient tolerated therapy well with no adverse effects. She arrived using quad cane in  right hand so provided brief instruction on  proper use. She did demonstrate good mechanics and safety with the quad cane. Patient exhibits improved knee motion this visit but overall still is lacking in both knee flexion and extension. Therapy focused on progressing knee mobility and strength with good tolerance. Updated HEP to progress stretching and strengthening at home. Patient would benefit from continued skilled PT to progress her knee motion and strength to reduce pain and maximize functional ability.     OBJECTIVE IMPAIRMENTS Abnormal gait, decreased activity tolerance, decreased balance, decreased endurance, decreased mobility, difficulty walking, decreased ROM, increased edema, improper body mechanics, postural dysfunction, and pain.    ACTIVITY LIMITATIONS carrying, lifting, bending, sitting, standing, squatting, sleeping, stairs, toileting, dressing, and locomotion level   PARTICIPATION LIMITATIONS: meal prep, cleaning, laundry, driving, shopping, community activity, and yard work   PERSONAL FACTORS Age, Fitness, and 3+ comorbidities: Rt THA, Lt THA, Rt TKA  are also affecting patient's functional outcome.      GOALS:   SHORT TERM GOALS: Target date: 08/29/2021  Patient will be independent and compliant with initial HEP.    Baseline: issued at eval  Goal status: INITIAL   2.  Patient will demonstrate at least 100 degrees Lt knee flexion AROM to improve ability to complete sit <> stand transfer.  Baseline: see above  Goal status: INITIAL   3.  Patient will demonstrate neutral Lt knee extension AROM to improve gait mechanics.  Baseline: see above  Goal status: INITIAL   4.  Patient will demonstrate at least 3+/5 Lt hip flexor strength to improve independence with bed mobility.  Baseline: 3-/5; needs assistance with lifting the LLE to transfer from sit to supine.  Goal status: INITIAL   LONG TERM GOALS: Target date: 09/26/2021    Patient will demonstrate at least 4/5 Lt knee strength to improve stability with  stair/curb negotiation.  Baseline:  Goal status: INITIAL   2.  Patient will complete TUG in </= 30 seconds to reduce fall risk.  Baseline: see above  Goal status: INITIAL   3.  Patient will score at least 81% on FOTO to signify clinically meaningful improvement in functional abilities.   Baseline: see above Goal status: INITIAL   4.  Patient will ambulate community distances without assistive device to improve her ability to grocery shop and attend community activities.  Baseline: RW;reports walking up/down her hallway in her home currently.  Goal status: INITIAL      PLAN: PT FREQUENCY: 2x/week   PT DURATION: 8 weeks   PLANNED INTERVENTIONS: Therapeutic exercises, Therapeutic activity, Neuromuscular re-education, Balance training, Gait training, Patient/Family education, Self Care, Joint mobilization, Stair training, Dry Needling, Cryotherapy, Moist heat, Vasopneumatic device, Manual therapy, and Re-evaluation   PLAN FOR NEXT SESSION:  How was MD appt? review and progress HEP prn; Lt knee ROM, gait training, quad strengthening      Hilda Blades, PT, DPT, LAT, ATC 08/18/21  10:51 AM Phone: [phone] Fax: [phone]

## 2021-08-18 NOTE — Patient Instructions (Signed)
Access Code: [email or web address] URL: [email or web address] Date: 08/18/2021 Prepared by: Hilda Blades  Exercises - Supine Quad Set  - 2 x daily - 2 sets - 10 reps - 5 seconds hold - Active Straight Leg Raise with Quad Set  - 1 x daily - 2 sets - 10 reps - Supine Heel Slide  - 2 x daily - 2 sets - 10 reps - 5 seconds hold - Sidelying Hip Abduction  - 1 x daily - 2 sets - 10 reps - Seated Long Arc Quad  - 1 x daily - 1 x weekly - 2 sets - 10 reps - Seated Hamstring Stretch  - 2 x daily - 1 sets - 3 reps - 45 hold

## 2021-08-21 ENCOUNTER — Ambulatory Visit: Payer: Medicare HMO

## 2021-08-25 ENCOUNTER — Ambulatory Visit: Payer: Medicare HMO

## 2021-08-25 DIAGNOSIS — M25562 Pain in left knee: Secondary | ICD-10-CM | POA: Diagnosis not present

## 2021-08-25 DIAGNOSIS — M6281 Muscle weakness (generalized): Secondary | ICD-10-CM

## 2021-08-25 DIAGNOSIS — M25662 Stiffness of left knee, not elsewhere classified: Secondary | ICD-10-CM

## 2021-08-25 DIAGNOSIS — R2689 Other abnormalities of gait and mobility: Secondary | ICD-10-CM

## 2021-08-25 DIAGNOSIS — R600 Localized edema: Secondary | ICD-10-CM

## 2021-08-25 NOTE — Therapy (Signed)
OUTPATIENT PHYSICAL THERAPY TREATMENT NOTE   Patient Name: Rhonda Hart MRN: [id number] DOB:[birth year], 76 y.o., female Today's Date: 08/25/2021  PCP: Antony Blackbird, MD   REFERRING PROVIDER: Paralee Cancel, MD  END OF SESSION:   PT End of Session - 08/25/21 1017     Visit Number 4    Number of Visits 17    Date for PT Re-Evaluation 09/27/21    Authorization Type [employer]    Authorization Time Period 08/02/21 - 09/13/21    Authorization - Visit Number 3    Authorization - Number of Visits 12    Progress Note Due on Visit 10    PT Start Time 1017    PT Stop Time 1111    PT Time Calculation (min) 54 min    Activity Tolerance Patient tolerated treatment well    Behavior During Therapy WFL for tasks assessed/performed               Past Medical History:  Diagnosis Date   Arthritis    GERD (gastroesophageal reflux disease)    History of colon polyps    benign   Hyperlipidemia    has never been on meds   Hypertension    takes Hyzaar,HCTZ,and Metoprolol daily   Joint pain    Past Surgical History:  Procedure Laterality Date   ABDOMINAL HYSTERECTOMY     CESAREAN SECTION     colonosocpy     JOINT REPLACEMENT     TOTAL HIP ARTHROPLASTY     x 2 on right and x 1 on left   TOTAL HIP REVISION Left 11/30/2014   Procedure: TOTAL LEFT HIP REVISION;  Surgeon: Earlie Server, MD;  Location: [facility];  Service: Orthopedics;  Laterality: Left;   TOTAL KNEE ARTHROPLASTY Right 04/19/2015   Procedure: RIGHT TOTAL KNEE ARTHROPLASTY;  Surgeon: Earlie Server, MD;  Location: [facility];  Service: Orthopedics;  Laterality: Right;   TOTAL KNEE ARTHROPLASTY Left 07/29/2021   Procedure: TOTAL KNEE ARTHROPLASTY;  Surgeon: Paralee Cancel, MD;  Location: [facility];  Service: Orthopedics;  Laterality: Left;   TOTAL SHOULDER ARTHROPLASTY Left    Patient Active Problem List   Diagnosis Date Noted   S/P total knee arthroplasty, left 07/29/2021   UTI (urinary tract infection) 04/21/2015   Primary  localized osteoarthritis of right knee 04/19/2015   Hypertension    Hyperlipidemia    S/P revision of total hip 12/01/2014   Status post revision of total hip replacement 11/30/2014    REFERRING DIAG: K74.259 (ICD-10-CM) - Presence of left artificial knee joint   THERAPY DIAG:  Acute pain of left knee  Stiffness of left knee, not elsewhere classified  Muscle weakness (generalized)  Other abnormalities of gait and mobility  Localized edema  Rationale for Evaluation and Treatment Rehabilitation  PERTINENT HISTORY: Rt TKA 2017, Bilateral THA x 2 on RLE and x 1 on LLE  PRECAUTIONS:  Fall   SUBJECTIVE: Patient reports the knee is feeling ok without pain currently. She had f/u with surgeon and had a "boil" on the incision that was drained and now she is taking an antibiotic with bandage in place over the knee.   PAIN:  Are you having pain? None currently; at worst Yes:  NPRS scale: 7/10 Pain location: Left knee Pain description: sharp, needles  Aggravating factors: prolonged standing, walking Relieving factors: rest    OBJECTIVE: (objective measures completed at initial evaluation unless otherwise dated) PATIENT SURVEYS:  FOTO 63% function    EDEMA:  Swelling noted about  the Lt knee   MUSCLE LENGTH: N/A   POSTURE: rounded shoulders and forward head   LOWER EXTREMITY ROM:   Active ROM Right eval Left eval Left 08/18/2021 08/25/21  Hip flexion        Hip extension        Hip abduction        Hip adduction        Hip internal rotation        Hip external rotation        Knee flexion 120 79 95 96  Knee extension WNL Lacking 10  Lacking 4 Lacking 3  Ankle dorsiflexion        Ankle plantarflexion        Ankle inversion        Ankle eversion         (Blank rows = not tested)   LOWER EXTREMITY MMT:   MMT Right eval Left eval  Hip flexion 4 3-  Hip extension      Hip abduction      Hip adduction      Hip internal rotation      Hip external rotation       Knee flexion 4+ Deferred due to recent post-operative status   Knee extension 4+ Deferred due to recent post-operative status  Ankle dorsiflexion      Ankle plantarflexion      Ankle inversion      Ankle eversion       (Blank rows = not tested)   LOWER EXTREMITY SPECIAL TESTS:  N/A   FUNCTIONAL TESTS:  TUG: 56 seconds   GAIT: Distance walked: 20 ft Assistive device utilized: Walker - 2 wheeled Level of assistance: Modified independence Comments: step to pattern; foot flat initial contact; limited knee flexion/extension throughout gait cycle; slow gait speed.        TODAY'S TREATMENT: [facility] Adult PT Treatment:                                                DATE: 08/25/21 Therapeutic Exercise: Nustep level 5 x 5 minutes; UE/LE SLR 2 x 10; LLE  Hip bridge 2 x 10  Calf stretch on wedge x 60 sec Mini squats with UE support 2 x 10  TKE blue band 2 x 10  Manual Therapy: Lt knee PROM to tolerance flexion/extension Patellar mobilizations all planes grade II-III Supine tib-fem mobilizations A/P grade II-III  [facility] Adult PT Treatment:                                                DATE: 08/18/21 Therapeutic Exercise: NuStep L4 x 5 min with UE/LE while taking subjective Seated heel slides AROM 2 x 5 with 5 sec hold Quad set 10 x 5 sec with small towel roll SLR 2 x 10 Sidelying hip abduction 2 x 10 Seated knee AROM 2 x 10 Manual: Seated and supine tib-fem mobs to improve knee motion Patellar mobs all directions to improve knee motion Knee PROM flexion/extension   [facility] Adult PT Treatment:  DATE: 08/11/21 Therapeutic Exercise: Seated heel slides on cloth- pt tends to IR hip and invert ankle, needs tactile and verbal cues to correct Seated hamstring stretch with strap assist to maintain neutral alignment and assist with DF, 45 sec x 3  Supine heel slide - 78 AAROM with strap  Supine QS into towel -(has not performed at home) 5 sec 10 x  2  Seated AAROM knee extension 5 x 2 - cues for active DF Gait with RW with cues for heel strike and step length    PATIENT EDUCATION:  Education details: N/A Person educated: N/A Education method: N/A Education comprehension: N/A   HOME EXERCISE PROGRAM: Access Code: [email or web address]    ASSESSMENT: CLINICAL IMPRESSION:  Continued focus on knee strengthening and mobility. Knee AROM is gradually improving, though limitations remain in both flexion and extension. She demonstrates normalized medial/lateral patella mobility, though slight limitation remains superiorly and inferiorly. She quickly fatigues with quadriceps strengthening, though no obvious quad lag present with SLR. Able to begin CKC strengthening, which she tolerated well, but quickly fatigues.    OBJECTIVE IMPAIRMENTS Abnormal gait, decreased activity tolerance, decreased balance, decreased endurance, decreased mobility, difficulty walking, decreased ROM, increased edema, improper body mechanics, postural dysfunction, and pain.    ACTIVITY LIMITATIONS carrying, lifting, bending, sitting, standing, squatting, sleeping, stairs, toileting, dressing, and locomotion level   PARTICIPATION LIMITATIONS: meal prep, cleaning, laundry, driving, shopping, community activity, and yard work   PERSONAL FACTORS Age, Fitness, and 3+ comorbidities: Rt THA, Lt THA, Rt TKA  are also affecting patient's functional outcome.      GOALS:   SHORT TERM GOALS: Target date: 08/29/2021  Patient will be independent and compliant with initial HEP.    Baseline: issued at eval  Goal status: met   2.  Patient will demonstrate at least 100 degrees Lt knee flexion AROM to improve ability to complete sit <> stand transfer.  Baseline: see above  Goal status: ongoing    3.  Patient will demonstrate neutral Lt knee extension AROM to improve gait mechanics.  Baseline: see above  Goal status: ongoing    4.  Patient will demonstrate at least 3+/5 Lt hip flexor  strength to improve independence with bed mobility.  Baseline: 3-/5; needs assistance with lifting the LLE to transfer from sit to supine.  Goal status: INITIAL   LONG TERM GOALS: Target date: 09/26/2021    Patient will demonstrate at least 4/5 Lt knee strength to improve stability with stair/curb negotiation.  Baseline:  Goal status: INITIAL   2.  Patient will complete TUG in </= 30 seconds to reduce fall risk.  Baseline: see above  Goal status: INITIAL   3.  Patient will score at least 81% on FOTO to signify clinically meaningful improvement in functional abilities.   Baseline: see above Goal status: INITIAL   4.  Patient will ambulate community distances without assistive device to improve her ability to grocery shop and attend community activities.  Baseline: RW;reports walking up/down her hallway in her home currently.  Goal status: INITIAL      PLAN: PT FREQUENCY: 2x/week   PT DURATION: 8 weeks   PLANNED INTERVENTIONS: Therapeutic exercises, Therapeutic activity, Neuromuscular re-education, Balance training, Gait training, Patient/Family education, Self Care, Joint mobilization, Stair training, Dry Needling, Cryotherapy, Moist heat, Vasopneumatic device, Manual therapy, and Re-evaluation   PLAN FOR NEXT SESSION:  review and progress HEP prn; Lt knee ROM, gait training, quad strengthening     Viktorya Grant, PT, DPT, ATC 08/25/21 11:13  AM

## 2021-08-28 ENCOUNTER — Ambulatory Visit: Payer: Medicare HMO

## 2021-08-28 DIAGNOSIS — R600 Localized edema: Secondary | ICD-10-CM

## 2021-08-28 DIAGNOSIS — M25562 Pain in left knee: Secondary | ICD-10-CM | POA: Diagnosis not present

## 2021-08-28 DIAGNOSIS — M6281 Muscle weakness (generalized): Secondary | ICD-10-CM

## 2021-08-28 DIAGNOSIS — M25662 Stiffness of left knee, not elsewhere classified: Secondary | ICD-10-CM

## 2021-08-28 DIAGNOSIS — R2689 Other abnormalities of gait and mobility: Secondary | ICD-10-CM

## 2021-08-28 NOTE — Therapy (Signed)
OUTPATIENT PHYSICAL THERAPY TREATMENT NOTE   Patient Name: Rhonda Hart MRN: [id number] DOB:04/25/1945, 76 y.o., female Today's Date: 08/28/2021  PCP: Antony Blackbird, MD   REFERRING PROVIDER: Paralee Cancel, MD  END OF SESSION:   PT End of Session - 08/28/21 1014     Visit Number 5    Number of Visits 17    Date for PT Re-Evaluation 09/27/21    Authorization Type [employer]    Authorization Time Period 08/02/21 - 09/13/21    Authorization - Visit Number 4    Authorization - Number of Visits 12    Progress Note Due on Visit 10    PT Start Time 9470    PT Stop Time 1058    PT Time Calculation (min) 43 min    Activity Tolerance Patient tolerated treatment well    Behavior During Therapy WFL for tasks assessed/performed                Past Medical History:  Diagnosis Date   Arthritis    GERD (gastroesophageal reflux disease)    History of colon polyps    benign   Hyperlipidemia    has never been on meds   Hypertension    takes Hyzaar,HCTZ,and Metoprolol daily   Joint pain    Past Surgical History:  Procedure Laterality Date   ABDOMINAL HYSTERECTOMY     CESAREAN SECTION     colonosocpy     JOINT REPLACEMENT     TOTAL HIP ARTHROPLASTY     x 2 on right and x 1 on left   TOTAL HIP REVISION Left 11/30/2014   Procedure: TOTAL LEFT HIP REVISION;  Surgeon: Earlie Server, MD;  Location: [facility];  Service: Orthopedics;  Laterality: Left;   TOTAL KNEE ARTHROPLASTY Right 04/19/2015   Procedure: RIGHT TOTAL KNEE ARTHROPLASTY;  Surgeon: Earlie Server, MD;  Location: [facility];  Service: Orthopedics;  Laterality: Right;   TOTAL KNEE ARTHROPLASTY Left 07/29/2021   Procedure: TOTAL KNEE ARTHROPLASTY;  Surgeon: Paralee Cancel, MD;  Location: [facility];  Service: Orthopedics;  Laterality: Left;   TOTAL SHOULDER ARTHROPLASTY Left    Patient Active Problem List   Diagnosis Date Noted   S/P total knee arthroplasty, left 07/29/2021   UTI (urinary tract infection) 04/21/2015   Primary  localized osteoarthritis of right knee 04/19/2015   Hypertension    Hyperlipidemia    S/P revision of total hip 12/01/2014   Status post revision of total hip replacement 11/30/2014    REFERRING DIAG: J62.836 (ICD-10-CM) - Presence of left artificial knee joint   THERAPY DIAG:  Acute pain of left knee  Stiffness of left knee, not elsewhere classified  Muscle weakness (generalized)  Other abnormalities of gait and mobility  Localized edema  Rationale for Evaluation and Treatment Rehabilitation  PERTINENT HISTORY: Rt TKA 2017, Bilateral THA x 2 on RLE and x 1 on LLE  PRECAUTIONS:  Fall   SUBJECTIVE: Patient reports the knee is feeing a little better. She reports compliance with HEP.   PAIN:  Are you having pain? None currently; at worst Yes:  NPRS scale: 6/10 Pain location: Left knee Pain description: sharp, needles  Aggravating factors: prolonged standing, walking Relieving factors: rest    OBJECTIVE: (objective measures completed at initial evaluation unless otherwise dated) PATIENT SURVEYS:  FOTO 63% function    EDEMA:  Swelling noted about the Lt knee   MUSCLE LENGTH: N/A   POSTURE: rounded shoulders and forward head   LOWER EXTREMITY ROM:   Active  ROM Right eval Left eval Left 08/18/2021 08/25/21  Hip flexion        Hip extension        Hip abduction        Hip adduction        Hip internal rotation        Hip external rotation        Knee flexion 120 79 95 96  Knee extension WNL Lacking 10  Lacking 4 Lacking 3  Ankle dorsiflexion        Ankle plantarflexion        Ankle inversion        Ankle eversion         (Blank rows = not tested)   LOWER EXTREMITY MMT:   MMT Right eval Left eval 08/28/21  Hip flexion 4 3- Left: 4-/5  Hip extension       Hip abduction       Hip adduction       Hip internal rotation       Hip external rotation       Knee flexion 4+ Deferred due to recent post-operative status    Knee extension 4+ Deferred due to  recent post-operative status   Ankle dorsiflexion       Ankle plantarflexion       Ankle inversion       Ankle eversion        (Blank rows = not tested)   LOWER EXTREMITY SPECIAL TESTS:  N/A   FUNCTIONAL TESTS:  TUG: 56 seconds   GAIT: Distance walked: 20 ft Assistive device utilized: Walker - 2 wheeled Level of assistance: Modified independence Comments: step to pattern; foot flat initial contact; limited knee flexion/extension throughout gait cycle; slow gait speed.        TODAY'S TREATMENT: [facility] Adult PT Treatment:                                                DATE: 08/28/21 Therapeutic Exercise: NuStep level 5 x 5 minutes LAQ 2 x 10 LLE Seated HS curl green band 2 x 10 LLE Standing march 2 x 10  Step ups 2 x 10 4 inch step  SLS LLE 3 trials 5-10 seconds  Updated HEP Manual Therapy: Lt knee PROM to tolerance flexion/extension Patellar mobilizations superior/inferior grade II-III Supine tib-fem mobilizations A/P grade II-III   [facility] Adult PT Treatment:                                                DATE: 08/25/21 Therapeutic Exercise: Nustep level 5 x 5 minutes; UE/LE SLR 2 x 10; LLE  Hip bridge 2 x 10  Calf stretch on wedge x 60 sec Mini squats with UE support 2 x 10  TKE blue band 2 x 10  Manual Therapy: Lt knee PROM to tolerance flexion/extension Patellar mobilizations all planes grade II-III Supine tib-fem mobilizations A/P grade II-III  [facility] Adult PT Treatment:  DATE: 08/18/21 Therapeutic Exercise: NuStep L4 x 5 min with UE/LE while taking subjective Seated heel slides AROM 2 x 5 with 5 sec hold Quad set 10 x 5 sec with small towel roll SLR 2 x 10 Sidelying hip abduction 2 x 10 Seated knee AROM 2 x 10 Manual: Seated and supine tib-fem mobs to improve knee motion Patellar mobs all directions to improve knee motion Knee PROM flexion/extension       PATIENT EDUCATION:  Education details: HEP Person  educated: patient and son Education method: Systems developer, cues, handout Education comprehension: returned demo, cues    HOME EXERCISE PROGRAM: Access Code: [email or web address]    ASSESSMENT: CLINICAL IMPRESSION: Patient tolerated session well today with continued focus on improving knee ROM and progression of hip and knee strengthening. She demonstrates improvement in Lt hip flexor strength since initial evaluation, having met this STG. She tolerated progression of standing strengthening well reporting occasional pulling sensation about anterior knee. She demonstrates good stability during SLS on the LLE with minimal sway and ability to maintain for 5-10 seconds.    OBJECTIVE IMPAIRMENTS Abnormal gait, decreased activity tolerance, decreased balance, decreased endurance, decreased mobility, difficulty walking, decreased ROM, increased edema, improper body mechanics, postural dysfunction, and pain.    ACTIVITY LIMITATIONS carrying, lifting, bending, sitting, standing, squatting, sleeping, stairs, toileting, dressing, and locomotion level   PARTICIPATION LIMITATIONS: meal prep, cleaning, laundry, driving, shopping, community activity, and yard work   PERSONAL FACTORS Age, Fitness, and 3+ comorbidities: Rt THA, Lt THA, Rt TKA  are also affecting patient's functional outcome.      GOALS:   SHORT TERM GOALS: Target date: 08/29/2021  Patient will be independent and compliant with initial HEP.    Baseline: issued at eval  Goal status: met   2.  Patient will demonstrate at least 100 degrees Lt knee flexion AROM to improve ability to complete sit <> stand transfer.  Baseline: see above  Goal status: ongoing    3.  Patient will demonstrate neutral Lt knee extension AROM to improve gait mechanics.  Baseline: see above  Goal status: ongoing    4.  Patient will demonstrate at least 3+/5 Lt hip flexor strength to improve independence with bed mobility.  Baseline: 3-/5; needs assistance with lifting the LLE to  transfer from sit to supine.  Goal status: met   LONG TERM GOALS: Target date: 09/26/2021    Patient will demonstrate at least 4/5 Lt knee strength to improve stability with stair/curb negotiation.  Baseline:  Goal status: INITIAL   2.  Patient will complete TUG in </= 30 seconds to reduce fall risk.  Baseline: see above  Goal status: INITIAL   3.  Patient will score at least 81% on FOTO to signify clinically meaningful improvement in functional abilities.   Baseline: see above Goal status: INITIAL   4.  Patient will ambulate community distances without assistive device to improve her ability to grocery shop and attend community activities.  Baseline: RW;reports walking up/down her hallway in her home currently.  Goal status: INITIAL      PLAN: PT FREQUENCY: 2x/week   PT DURATION: 8 weeks   PLANNED INTERVENTIONS: Therapeutic exercises, Therapeutic activity, Neuromuscular re-education, Balance training, Gait training, Patient/Family education, Self Care, Joint mobilization, Stair training, Dry Needling, Cryotherapy, Moist heat, Vasopneumatic device, Manual therapy, and Re-evaluation   PLAN FOR NEXT SESSION:  review and progress HEP prn; Lt knee ROM, gait training, quad strengthening     Sholonda Grant, PT, DPT, ATC 08/28/21 11:00 AM

## 2021-09-01 ENCOUNTER — Ambulatory Visit: Payer: Medicare HMO

## 2021-09-01 DIAGNOSIS — M25662 Stiffness of left knee, not elsewhere classified: Secondary | ICD-10-CM

## 2021-09-01 DIAGNOSIS — M25562 Pain in left knee: Secondary | ICD-10-CM | POA: Diagnosis not present

## 2021-09-01 DIAGNOSIS — R600 Localized edema: Secondary | ICD-10-CM

## 2021-09-01 DIAGNOSIS — M6281 Muscle weakness (generalized): Secondary | ICD-10-CM

## 2021-09-01 DIAGNOSIS — R2689 Other abnormalities of gait and mobility: Secondary | ICD-10-CM

## 2021-09-01 NOTE — Therapy (Signed)
OUTPATIENT PHYSICAL THERAPY TREATMENT NOTE   Patient Name: Rhonda Hart MRN: [id number] DOB:1945/05/28, 76 y.o., female Today's Date: 09/01/2021  PCP: Antony Blackbird, MD   REFERRING PROVIDER: Paralee Cancel, MD  END OF SESSION:   PT End of Session - 09/01/21 1017     Visit Number 6    Number of Visits 17    Date for PT Re-Evaluation 09/27/21    Authorization Type [employer]    Authorization Time Period 08/02/21 - 09/13/21    Authorization - Visit Number 5    Authorization - Number of Visits 12    Progress Note Due on Visit 10    PT Start Time 1016    PT Stop Time 1059    PT Time Calculation (min) 43 min    Activity Tolerance Patient tolerated treatment well    Behavior During Therapy WFL for tasks assessed/performed                 Past Medical History:  Diagnosis Date   Arthritis    GERD (gastroesophageal reflux disease)    History of colon polyps    benign   Hyperlipidemia    has never been on meds   Hypertension    takes Hyzaar,HCTZ,and Metoprolol daily   Joint pain    Past Surgical History:  Procedure Laterality Date   ABDOMINAL HYSTERECTOMY     CESAREAN SECTION     colonosocpy     JOINT REPLACEMENT     TOTAL HIP ARTHROPLASTY     x 2 on right and x 1 on left   TOTAL HIP REVISION Left 11/30/2014   Procedure: TOTAL LEFT HIP REVISION;  Surgeon: Earlie Server, MD;  Location: [facility];  Service: Orthopedics;  Laterality: Left;   TOTAL KNEE ARTHROPLASTY Right 04/19/2015   Procedure: RIGHT TOTAL KNEE ARTHROPLASTY;  Surgeon: Earlie Server, MD;  Location: [place] [facility];  Service: Orthopedics;  Laterality: Right;   TOTAL KNEE ARTHROPLASTY Left 07/29/2021   Procedure: TOTAL KNEE ARTHROPLASTY;  Surgeon: Paralee Cancel, MD;  Location: [facility];  Service: Orthopedics;  Laterality: Left;   TOTAL SHOULDER ARTHROPLASTY Left    Patient Active Problem List   Diagnosis Date Noted   S/P total knee arthroplasty, left 07/29/2021   UTI (urinary tract infection) 04/21/2015   Primary  localized osteoarthritis of right knee 04/19/2015   Hypertension    Hyperlipidemia    S/P revision of total hip 12/01/2014   Status post revision of total hip replacement 11/30/2014    REFERRING DIAG: T36.468 (ICD-10-CM) - Presence of left artificial knee joint   THERAPY DIAG:  Acute pain of left knee  Stiffness of left knee, not elsewhere classified  Muscle weakness (generalized)  Other abnormalities of gait and mobility  Localized edema  Rationale for Evaluation and Treatment Rehabilitation  PERTINENT HISTORY: Rt TKA 2017, Bilateral THA x 2 on RLE and x 1 on LLE  PRECAUTIONS:  Fall   SUBJECTIVE: Patient reports the knee is hurting a little bit right now. She reports her exercises are going well.   PAIN:  Are you having pain? Yes:  NPRS scale: 4/10 Pain location: Left knee Pain description: sharp, needles  Aggravating factors: prolonged standing, walking Relieving factors: rest    OBJECTIVE: (objective measures completed at initial evaluation unless otherwise dated) PATIENT SURVEYS:  FOTO 63% function  09/01/21: 65% function   EDEMA:  Swelling noted about the Lt knee   MUSCLE LENGTH: N/A   POSTURE: rounded shoulders and forward head   LOWER EXTREMITY  ROM:   Active ROM Right eval Left eval Left 08/18/2021 08/25/21 09/01/21  Hip flexion         Hip extension         Hip abduction         Hip adduction         Hip internal rotation         Hip external rotation         Knee flexion 120 79 95 96 101  Knee extension WNL Lacking 10  Lacking 4 Lacking 3   Ankle dorsiflexion         Ankle plantarflexion         Ankle inversion         Ankle eversion          (Blank rows = not tested)   LOWER EXTREMITY MMT:   MMT Right eval Left eval 08/28/21  Hip flexion 4 3- Left: 4-/5  Hip extension       Hip abduction       Hip adduction       Hip internal rotation       Hip external rotation       Knee flexion 4+ Deferred due to recent post-operative status     Knee extension 4+ Deferred due to recent post-operative status   Ankle dorsiflexion       Ankle plantarflexion       Ankle inversion       Ankle eversion        (Blank rows = not tested)   LOWER EXTREMITY SPECIAL TESTS:  N/A   FUNCTIONAL TESTS:  TUG: 56 seconds   GAIT: Distance walked: 20 ft Assistive device utilized: Walker - 2 wheeled Level of assistance: Modified independence Comments: step to pattern; foot flat initial contact; limited knee flexion/extension throughout gait cycle; slow gait speed.        TODAY'S TREATMENT: [facility] Adult PT Treatment:                                                DATE: 09/01/21 Therapeutic Exercise: NuStep level 5 x 5 minutes  Sit to stand from raised height 1 x 10  Standing hip abduction green band 2 x 10  Standing hip extension green band 2 x 10  Standing calf raise 2 x 15  TKE 1 x 10 black band  Manual Therapy: Lt knee PROM to tolerance flexion/extension Patellar mobilizations superior/inferior grade II-III Supine tib-fem mobilizations A/P grade II-III   [facility] Adult PT Treatment:                                                DATE: 08/28/21 Therapeutic Exercise: NuStep level 5 x 5 minutes LAQ 2 x 10 LLE Seated HS curl green band 2 x 10 LLE Standing march 2 x 10  Step ups 2 x 10 4 inch step  SLS LLE 3 trials 5-10 seconds  Updated HEP Manual Therapy: Lt knee PROM to tolerance flexion/extension Patellar mobilizations superior/inferior grade II-III Supine tib-fem mobilizations A/P grade II-III   [facility] Adult PT Treatment:  DATE: 08/25/21 Therapeutic Exercise: Nustep level 5 x 5 minutes; UE/LE SLR 2 x 10; LLE  Hip bridge 2 x 10  Calf stretch on wedge x 60 sec Mini squats with UE support 2 x 10  TKE blue band 2 x 10  Manual Therapy: Lt knee PROM to tolerance flexion/extension Patellar mobilizations all planes grade II-III Supine tib-fem mobilizations A/P grade II-III         PATIENT EDUCATION:  Education details: N/A Person educated: N/A Education method: N/A Education comprehension:N/A HOME EXERCISE PROGRAM: Access Code: [email or web address]    ASSESSMENT: CLINICAL IMPRESSION: Patient's knee AROM continues to gradually improve, achieving 101 degrees of flexion AROM today. With sit to stand she has initial difficulty controlling descent and excessive anterior tibial translation. She reported intermittent Rt knee pain throughout session, though overall tolerated progression of standing strengthening well.    OBJECTIVE IMPAIRMENTS Abnormal gait, decreased activity tolerance, decreased balance, decreased endurance, decreased mobility, difficulty walking, decreased ROM, increased edema, improper body mechanics, postural dysfunction, and pain.    ACTIVITY LIMITATIONS carrying, lifting, bending, sitting, standing, squatting, sleeping, stairs, toileting, dressing, and locomotion level   PARTICIPATION LIMITATIONS: meal prep, cleaning, laundry, driving, shopping, community activity, and yard work   PERSONAL FACTORS Age, Fitness, and 3+ comorbidities: Rt THA, Lt THA, Rt TKA  are also affecting patient's functional outcome.      GOALS:   SHORT TERM GOALS: Target date: 08/29/2021  Patient will be independent and compliant with initial HEP.    Baseline: issued at eval  Goal status: met   2.  Patient will demonstrate at least 100 degrees Lt knee flexion AROM to improve ability to complete sit <> stand transfer.  Baseline: see above  Goal status: met    3.  Patient will demonstrate neutral Lt knee extension AROM to improve gait mechanics.  Baseline: see above  Goal status: ongoing    4.  Patient will demonstrate at least 3+/5 Lt hip flexor strength to improve independence with bed mobility.  Baseline: 3-/5; needs assistance with lifting the LLE to transfer from sit to supine.  Goal status: met   LONG TERM GOALS: Target date: 09/26/2021    Patient will demonstrate at  least 4/5 Lt knee strength to improve stability with stair/curb negotiation.  Baseline:  Goal status: INITIAL   2.  Patient will complete TUG in </= 30 seconds to reduce fall risk.  Baseline: see above  Goal status: INITIAL   3.  Patient will score at least 81% on FOTO to signify clinically meaningful improvement in functional abilities.   Baseline: see above Goal status: INITIAL   4.  Patient will ambulate community distances without assistive device to improve her ability to grocery shop and attend community activities.  Baseline: RW;reports walking up/down her hallway in her home currently.  Goal status: INITIAL      PLAN: PT FREQUENCY: 2x/week   PT DURATION: 8 weeks   PLANNED INTERVENTIONS: Therapeutic exercises, Therapeutic activity, Neuromuscular re-education, Balance training, Gait training, Patient/Family education, Self Care, Joint mobilization, Stair training, Dry Needling, Cryotherapy, Moist heat, Vasopneumatic device, Manual therapy, and Re-evaluation   PLAN FOR NEXT SESSION:  review and progress HEP prn; Lt knee ROM, gait training, quad strengthening     Sakai Grant, PT, DPT, ATC 09/01/21 11:00 AM

## 2021-09-04 ENCOUNTER — Ambulatory Visit: Payer: Medicare HMO

## 2021-09-04 DIAGNOSIS — M25662 Stiffness of left knee, not elsewhere classified: Secondary | ICD-10-CM

## 2021-09-04 DIAGNOSIS — M25562 Pain in left knee: Secondary | ICD-10-CM

## 2021-09-04 DIAGNOSIS — R2689 Other abnormalities of gait and mobility: Secondary | ICD-10-CM

## 2021-09-04 DIAGNOSIS — R600 Localized edema: Secondary | ICD-10-CM

## 2021-09-04 DIAGNOSIS — M6281 Muscle weakness (generalized): Secondary | ICD-10-CM

## 2021-09-04 NOTE — Therapy (Signed)
OUTPATIENT PHYSICAL THERAPY TREATMENT NOTE   Patient Name: Rhonda Hart MRN: [id number] DOB:1945-04-07, 76 y.o., female Today's Date: 09/04/2021  PCP: Antony Blackbird, MD   REFERRING PROVIDER: Paralee Cancel, MD  END OF SESSION:   PT End of Session - 09/04/21 1015     Visit Number 7    Number of Visits 17    Date for PT Re-Evaluation 09/27/21    Authorization Type [employer]    Authorization Time Period 08/02/21 - 09/13/21    Authorization - Visit Number 6    Authorization - Number of Visits 12    Progress Note Due on Visit 10    PT Start Time 1015    PT Stop Time 1057    PT Time Calculation (min) 42 min    Activity Tolerance Patient tolerated treatment well    Behavior During Therapy WFL for tasks assessed/performed                 Past Medical History:  Diagnosis Date   Arthritis    GERD (gastroesophageal reflux disease)    History of colon polyps    benign   Hyperlipidemia    has never been on meds   Hypertension    takes Hyzaar,HCTZ,and Metoprolol daily   Joint pain    Past Surgical History:  Procedure Laterality Date   ABDOMINAL HYSTERECTOMY     CESAREAN SECTION     colonosocpy     JOINT REPLACEMENT     TOTAL HIP ARTHROPLASTY     x 2 on right and x 1 on left   TOTAL HIP REVISION Left 11/30/2014   Procedure: TOTAL LEFT HIP REVISION;  Surgeon: Earlie Server, MD;  Location: [place];  Service: Orthopedics;  Laterality: Left;   TOTAL KNEE ARTHROPLASTY Right 04/19/2015   Procedure: RIGHT TOTAL KNEE ARTHROPLASTY;  Surgeon: Earlie Server, MD;  Location: [facility];  Service: Orthopedics;  Laterality: Right;   TOTAL KNEE ARTHROPLASTY Left 07/29/2021   Procedure: TOTAL KNEE ARTHROPLASTY;  Surgeon: Paralee Cancel, MD;  Location: [facility];  Service: Orthopedics;  Laterality: Left;   TOTAL SHOULDER ARTHROPLASTY Left    Patient Active Problem List   Diagnosis Date Noted   S/P total knee arthroplasty, left 07/29/2021   UTI (urinary tract infection) 04/21/2015   Primary  localized osteoarthritis of right knee 04/19/2015   Hypertension    Hyperlipidemia    S/P revision of total hip 12/01/2014   Status post revision of total hip replacement 11/30/2014    REFERRING DIAG: T05.697 (ICD-10-CM) - Presence of left artificial knee joint   THERAPY DIAG:  Acute pain of left knee  Stiffness of left knee, not elsewhere classified  Muscle weakness (generalized)  Other abnormalities of gait and mobility  Localized edema  Rationale for Evaluation and Treatment Rehabilitation  PERTINENT HISTORY: Rt TKA 2017, Bilateral THA x 2 on RLE and x 1 on LLE  PRECAUTIONS:  Fall   SUBJECTIVE: Patient reports no pain right now, but has pain at night. Her surgeon is going to put in another order for an antibiotic for the knee as she still has an open wound from where it was recently drained.   PAIN:  Are you having pain? none currently at worst:  NPRS scale: 5/10 Pain location: Left knee Pain description: sharp, needles  Aggravating factors: prolonged standing, walking Relieving factors: rest    OBJECTIVE: (objective measures completed at initial evaluation unless otherwise dated) PATIENT SURVEYS:  FOTO 63% function  09/01/21: 65% function   EDEMA:  Swelling noted about the Lt knee   MUSCLE LENGTH: N/A   POSTURE: rounded shoulders and forward head   LOWER EXTREMITY ROM:   Active ROM Right eval Left eval Left 08/18/2021 08/25/21 09/01/21 09/04/21  Hip flexion          Hip extension          Hip abduction          Hip adduction          Hip internal rotation          Hip external rotation          Knee flexion 120 79 95 96 101 104  Knee extension WNL Lacking 10  Lacking 4 Lacking 3  Lacking 1   Ankle dorsiflexion          Ankle plantarflexion          Ankle inversion          Ankle eversion           (Blank rows = not tested)   LOWER EXTREMITY MMT:   MMT Right eval Left eval 08/28/21  Hip flexion 4 3- Left: 4-/5  Hip extension       Hip abduction        Hip adduction       Hip internal rotation       Hip external rotation       Knee flexion 4+ Deferred due to recent post-operative status    Knee extension 4+ Deferred due to recent post-operative status   Ankle dorsiflexion       Ankle plantarflexion       Ankle inversion       Ankle eversion        (Blank rows = not tested)   LOWER EXTREMITY SPECIAL TESTS:  N/A   FUNCTIONAL TESTS:  TUG: 56 seconds   GAIT: Distance walked: 20 ft Assistive device utilized: Walker - 2 wheeled Level of assistance: Modified independence Comments: step to pattern; foot flat initial contact; limited knee flexion/extension throughout gait cycle; slow gait speed.        TODAY'S TREATMENT: [facility] Adult PT Treatment:                                                DATE: 09/04/21 Therapeutic Exercise: NuStep level 6 x  5 minutes SLR 2 x 10 LLE  Sit to stand 2 x 10 raised height  LAQ 2 x 10; 2 # Step ups 6 inch step 2 x 10  Manual Therapy: Lt knee PROM to tolerance flexion/extension Supine tib-fem mobilizations A/P grade II-III   [facility] Adult PT Treatment:                                                DATE: 09/01/21 Therapeutic Exercise: NuStep level 5 x 5 minutes  Sit to stand from raised height 1 x 10  Standing hip abduction green band 2 x 10  Standing hip extension green band 2 x 10  Standing calf raise 2 x 15  TKE 1 x 10 black band  Manual Therapy: Lt knee PROM to tolerance flexion/extension Patellar mobilizations superior/inferior grade II-III Supine tib-fem mobilizations A/P grade II-III   [facility]  Adult PT Treatment:                                                DATE: 08/28/21 Therapeutic Exercise: NuStep level 5 x 5 minutes LAQ 2 x 10 LLE Seated HS curl green band 2 x 10 LLE Standing march 2 x 10  Step ups 2 x 10 4 inch step  SLS LLE 3 trials 5-10 seconds  Updated HEP Manual Therapy: Lt knee PROM to tolerance flexion/extension Patellar mobilizations superior/inferior grade  II-III Supine tib-fem mobilizations A/P grade II-III          PATIENT EDUCATION:  Education details: N/A Person educated: N/A Education method: N/A Education comprehension:N/A HOME EXERCISE PROGRAM: Access Code: [email or web address]    ASSESSMENT: CLINICAL IMPRESSION: Patient's Lt knee AROM continues to gradually improve achieving 104 degrees of flexion and lacking 1 degree of extension today. Continued focus on quad strengthening with patient quickly fatiguing with SLR having difficulty maintaining quad contraction during descent. With sit to stand she has difficulty controlling for Rt hip shift during descent requiring frequent cues to allow equal weight-bearing through BLEs. She tolerated session well today reporting no pain at conclusion of session.    OBJECTIVE IMPAIRMENTS Abnormal gait, decreased activity tolerance, decreased balance, decreased endurance, decreased mobility, difficulty walking, decreased ROM, increased edema, improper body mechanics, postural dysfunction, and pain.    ACTIVITY LIMITATIONS carrying, lifting, bending, sitting, standing, squatting, sleeping, stairs, toileting, dressing, and locomotion level   PARTICIPATION LIMITATIONS: meal prep, cleaning, laundry, driving, shopping, community activity, and yard work   PERSONAL FACTORS Age, Fitness, and 3+ comorbidities: Rt THA, Lt THA, Rt TKA  are also affecting patient's functional outcome.      GOALS:   SHORT TERM GOALS: Target date: 08/29/2021  Patient will be independent and compliant with initial HEP.    Baseline: issued at eval  Goal status: met   2.  Patient will demonstrate at least 100 degrees Lt knee flexion AROM to improve ability to complete sit <> stand transfer.  Baseline: see above  Goal status: met    3.  Patient will demonstrate neutral Lt knee extension AROM to improve gait mechanics.  Baseline: see above  Goal status: ongoing    4.  Patient will demonstrate at least 3+/5 Lt hip flexor  strength to improve independence with bed mobility.  Baseline: 3-/5; needs assistance with lifting the LLE to transfer from sit to supine.  Goal status: met   LONG TERM GOALS: Target date: 09/26/2021    Patient will demonstrate at least 4/5 Lt knee strength to improve stability with stair/curb negotiation.  Baseline:  Goal status: INITIAL   2.  Patient will complete TUG in </= 30 seconds to reduce fall risk.  Baseline: see above  Goal status: INITIAL   3.  Patient will score at least 81% on FOTO to signify clinically meaningful improvement in functional abilities.   Baseline: see above Goal status: INITIAL   4.  Patient will ambulate community distances without assistive device to improve her ability to grocery shop and attend community activities.  Baseline: RW;reports walking up/down her hallway in her home currently.  Goal status: INITIAL      PLAN: PT FREQUENCY: 2x/week   PT DURATION: 8 weeks   PLANNED INTERVENTIONS: Therapeutic exercises, Therapeutic activity, Neuromuscular re-education, Balance training, Gait training, Patient/Family education, Self Care, Joint mobilization,  Stair training, Dry Needling, Cryotherapy, Moist heat, Vasopneumatic device, Manual therapy, and Re-evaluation   PLAN FOR NEXT SESSION:  review and progress HEP prn; Lt knee ROM, gait training, quad strengthening     Kyera Grant, PT, DPT, ATC 09/04/21 10:58 AM

## 2021-09-08 ENCOUNTER — Ambulatory Visit: Payer: Medicare HMO

## 2021-09-08 DIAGNOSIS — R600 Localized edema: Secondary | ICD-10-CM

## 2021-09-08 DIAGNOSIS — M25562 Pain in left knee: Secondary | ICD-10-CM | POA: Diagnosis not present

## 2021-09-08 DIAGNOSIS — M6281 Muscle weakness (generalized): Secondary | ICD-10-CM

## 2021-09-08 DIAGNOSIS — R2689 Other abnormalities of gait and mobility: Secondary | ICD-10-CM

## 2021-09-08 DIAGNOSIS — M25662 Stiffness of left knee, not elsewhere classified: Secondary | ICD-10-CM

## 2021-09-08 NOTE — Therapy (Addendum)
OUTPATIENT PHYSICAL THERAPY TREATMENT NOTE PHYSICAL THERAPY DISCHARGE SUMMARY  Visits from Start of Care: 8  Current functional level related to goals / functional outcomes: See goals below   Remaining deficits: Status unknown   Education / Equipment: N/a    Patient agrees to discharge. Patient goals were partially met. Patient is being discharged due to not returning since the last visit.   Patient Name: Rhonda Hart MRN: [id number] DOB:November 07, 1945, 76 y.o., female Today's Date: 09/08/2021  PCP: Antony Blackbird, MD   REFERRING PROVIDER: Paralee Cancel, MD  END OF SESSION:   PT End of Session - 09/08/21 1016     Visit Number 8    Number of Visits 17    Date for PT Re-Evaluation 09/27/21    Authorization Type [employer]    Authorization Time Period 08/02/21 - 09/13/21    Authorization - Visit Number 7    Authorization - Number of Visits 12    Progress Note Due on Visit 10    PT Start Time 1016    PT Stop Time 1056    PT Time Calculation (min) 40 min    Activity Tolerance Patient tolerated treatment well    Behavior During Therapy WFL for tasks assessed/performed                  Past Medical History:  Diagnosis Date   Arthritis    GERD (gastroesophageal reflux disease)    History of colon polyps    benign   Hyperlipidemia    has never been on meds   Hypertension    takes Hyzaar,HCTZ,and Metoprolol daily   Joint pain    Past Surgical History:  Procedure Laterality Date   ABDOMINAL HYSTERECTOMY     CESAREAN SECTION     colonosocpy     JOINT REPLACEMENT     TOTAL HIP ARTHROPLASTY     x 2 on right and x 1 on left   TOTAL HIP REVISION Left 11/30/2014   Procedure: TOTAL LEFT HIP REVISION;  Surgeon: Earlie Server, MD;  Location: [facility];  Service: Orthopedics;  Laterality: Left;   TOTAL KNEE ARTHROPLASTY Right 04/19/2015   Procedure: RIGHT TOTAL KNEE ARTHROPLASTY;  Surgeon: Earlie Server, MD;  Location: [facility];  Service: Orthopedics;  Laterality: Right;    TOTAL KNEE ARTHROPLASTY Left 07/29/2021   Procedure: TOTAL KNEE ARTHROPLASTY;  Surgeon: Paralee Cancel, MD;  Location: [facility];  Service: Orthopedics;  Laterality: Left;   TOTAL SHOULDER ARTHROPLASTY Left    Patient Active Problem List   Diagnosis Date Noted   S/P total knee arthroplasty, left 07/29/2021   UTI (urinary tract infection) 04/21/2015   Primary localized osteoarthritis of right knee 04/19/2015   Hypertension    Hyperlipidemia    S/P revision of total hip 12/01/2014   Status post revision of total hip replacement 11/30/2014    REFERRING DIAG: A07.622 (ICD-10-CM) - Presence of left artificial knee joint   THERAPY DIAG:  Acute pain of left knee  Stiffness of left knee, not elsewhere classified  Muscle weakness (generalized)  Other abnormalities of gait and mobility  Localized edema  Rationale for Evaluation and Treatment Rehabilitation  PERTINENT HISTORY: Rt TKA 2017, Bilateral THA x 2 on RLE and x 1 on LLE  PRECAUTIONS:  Fall   SUBJECTIVE: "It's not that bad. Maybe about a 5." She has f/u with surgeon on Wednesday.  PAIN:  Are you having pain? yes NPRS scale: 5/10 Pain location: Left knee Pain description: sharp, needles  Aggravating factors:  prolonged sitting and laying down Relieving factors: pain medication    OBJECTIVE: (objective measures completed at initial evaluation unless otherwise dated) PATIENT SURVEYS:  FOTO 63% function  09/01/21: 65% function   EDEMA:  Swelling noted about the Lt knee   MUSCLE LENGTH: N/A   POSTURE: rounded shoulders and forward head   LOWER EXTREMITY ROM:   Active ROM Right eval Left eval Left 08/18/2021 08/25/21 09/01/21 09/04/21 09/08/21 Left  Hip flexion           Hip extension           Hip abduction           Hip adduction           Hip internal rotation           Hip external rotation           Knee flexion 120 79 95 96 101 104 109  Knee extension WNL Lacking 10  Lacking 4 Lacking 3  Lacking 1    Ankle  dorsiflexion           Ankle plantarflexion           Ankle inversion           Ankle eversion            (Blank rows = not tested)   LOWER EXTREMITY MMT:   MMT Right eval Left eval 08/28/21  Hip flexion 4 3- Left: 4-/5  Hip extension       Hip abduction       Hip adduction       Hip internal rotation       Hip external rotation       Knee flexion 4+ Deferred due to recent post-operative status    Knee extension 4+ Deferred due to recent post-operative status   Ankle dorsiflexion       Ankle plantarflexion       Ankle inversion       Ankle eversion        (Blank rows = not tested)   LOWER EXTREMITY SPECIAL TESTS:  N/A   FUNCTIONAL TESTS:  TUG: 56 seconds   GAIT: Distance walked: 20 ft Assistive device utilized: Walker - 2 wheeled Level of assistance: Modified independence Comments: step to pattern; foot flat initial contact; limited knee flexion/extension throughout gait cycle; slow gait speed.        TODAY'S TREATMENT: [facility] Adult PT Treatment:                                                DATE: 09/08/21 Therapeutic Exercise: Recumbent bike rocking x 5 minutes; no resistance  Resisted knee extension 2 x 10 @ 5 lbs  TKE black band 2 x 10 LLE  Manual Therapy: Lt knee PROM to tolerance flexion/extension Supine tib-fem mobilizations A/P grade II-III Neuromuscular re-ed: SLS x 2 each 15-30 seconds  Tandem on airex 2 x 30 sec each Romberg on foam eyes closed 3 x 30 sec    [facility] Adult PT Treatment:                                                DATE: 09/04/21 Therapeutic Exercise: NuStep level 6 x  5 minutes SLR 2 x 10 LLE  Sit to stand 2 x 10 raised height  LAQ 2 x 10; 2 # Step ups 6 inch step 2 x 10  Manual Therapy: Lt knee PROM to tolerance flexion/extension Supine tib-fem mobilizations A/P grade II-III   [facility] Adult PT Treatment:                                                DATE: 09/01/21 Therapeutic Exercise: NuStep level 5 x 5 minutes  Sit to stand  from raised height 1 x 10  Standing hip abduction green band 2 x 10  Standing hip extension green band 2 x 10  Standing calf raise 2 x 15  TKE 1 x 10 black band  Manual Therapy: Lt knee PROM to tolerance flexion/extension Patellar mobilizations superior/inferior grade II-III Supine tib-fem mobilizations A/P grade II-III            PATIENT EDUCATION:  Education details: N/A Person educated: N/A Education method: N/A Education comprehension:N/A HOME EXERCISE PROGRAM: Access Code: [email or web address]    ASSESSMENT: CLINICAL IMPRESSION:  Patient is nearly 6 weeks s/p Lt TKA making gradual gains in her knee AROM. She is able to achieve 109 degrees of knee flexion today. Continued with quadriceps strengthening, which she tolerated well without reports of increased pain. Began balance training with patient demonstrating good stability with eyes open static balance activity, but with eyes closed she demonstrates significant sway and occasional LOB requiring reaching strategy to regain her balance. No reports of increased pain throughout session.   OBJECTIVE IMPAIRMENTS Abnormal gait, decreased activity tolerance, decreased balance, decreased endurance, decreased mobility, difficulty walking, decreased ROM, increased edema, improper body mechanics, postural dysfunction, and pain.    ACTIVITY LIMITATIONS carrying, lifting, bending, sitting, standing, squatting, sleeping, stairs, toileting, dressing, and locomotion level   PARTICIPATION LIMITATIONS: meal prep, cleaning, laundry, driving, shopping, community activity, and yard work   PERSONAL FACTORS Age, Fitness, and 3+ comorbidities: Rt THA, Lt THA, Rt TKA  are also affecting patient's functional outcome.      GOALS:   SHORT TERM GOALS: Target date: 08/29/2021  Patient will be independent and compliant with initial HEP.    Baseline: issued at eval  Goal status: met   2.  Patient will demonstrate at least 100 degrees Lt knee flexion AROM to  improve ability to complete sit <> stand transfer.  Baseline: see above  Goal status: met    3.  Patient will demonstrate neutral Lt knee extension AROM to improve gait mechanics.  Baseline: see above  Goal status: ongoing    4.  Patient will demonstrate at least 3+/5 Lt hip flexor strength to improve independence with bed mobility.  Baseline: 3-/5; needs assistance with lifting the LLE to transfer from sit to supine.  Goal status: met   LONG TERM GOALS: Target date: 09/26/2021    Patient will demonstrate at least 4/5 Lt knee strength to improve stability with stair/curb negotiation.  Baseline:  Goal status: INITIAL   2.  Patient will complete TUG in </= 30 seconds to reduce fall risk.  Baseline: see above  Goal status: INITIAL   3.  Patient will score at least 81% on FOTO to signify clinically meaningful improvement in functional abilities.   Baseline: see above Goal status: INITIAL   4.  Patient will ambulate community distances without assistive device  to improve her ability to grocery shop and attend community activities.  Baseline: RW;reports walking up/down her hallway in her home currently.  Goal status: INITIAL      PLAN: PT FREQUENCY: 2x/week   PT DURATION: 8 weeks   PLANNED INTERVENTIONS: Therapeutic exercises, Therapeutic activity, Neuromuscular re-education, Balance training, Gait training, Patient/Family education, Self Care, Joint mobilization, Stair training, Dry Needling, Cryotherapy, Moist heat, Vasopneumatic device, Manual therapy, and Re-evaluation   PLAN FOR NEXT SESSION:  review and progress HEP prn; Lt knee ROM, gait training, quad strengthening     Deloria Grant, PT, DPT, ATC 09/08/21 11:00 AM  Jaquitta Grant, PT, DPT, ATC 10/13/21 11:05 AM

## 2021-09-11 ENCOUNTER — Ambulatory Visit: Payer: Medicare HMO

## 2021-09-16 ENCOUNTER — Ambulatory Visit: Payer: Medicare HMO | Admitting: Physical Therapy

## 2021-09-18 ENCOUNTER — Ambulatory Visit: Payer: Medicare HMO

## 2021-09-22 ENCOUNTER — Ambulatory Visit: Payer: Medicare HMO

## 2022-09-15 ENCOUNTER — Encounter: Payer: Self-pay | Admitting: Orthopedic Surgery

## 2022-09-16 ENCOUNTER — Other Ambulatory Visit: Payer: Self-pay | Admitting: Orthopedic Surgery

## 2022-09-16 DIAGNOSIS — M5416 Radiculopathy, lumbar region: Secondary | ICD-10-CM

## 2022-10-19 ENCOUNTER — Ambulatory Visit
Admission: RE | Admit: 2022-10-19 | Discharge: 2022-10-19 | Disposition: A | Discharge status: Home / Self Care | Payer: Medicare HMO | Source: Ambulatory Visit | Attending: Orthopedic Surgery | Admitting: Orthopedic Surgery

## 2022-10-19 DIAGNOSIS — M5416 Radiculopathy, lumbar region: Secondary | ICD-10-CM

## 2023-06-12 ENCOUNTER — Ambulatory Visit: Admission: EM | Admit: 2023-06-12 | Discharge: 2023-06-12 | Disposition: A | Discharge status: Home / Self Care

## 2023-06-12 ENCOUNTER — Other Ambulatory Visit: Payer: Self-pay

## 2023-06-12 ENCOUNTER — Encounter: Payer: Self-pay | Admitting: *Deleted

## 2023-06-12 DIAGNOSIS — H6123 Impacted cerumen, bilateral: Secondary | ICD-10-CM

## 2023-06-12 DIAGNOSIS — R051 Acute cough: Secondary | ICD-10-CM | POA: Diagnosis not present

## 2023-06-12 DIAGNOSIS — J22 Unspecified acute lower respiratory infection: Secondary | ICD-10-CM | POA: Diagnosis not present

## 2023-06-12 DIAGNOSIS — H9201 Otalgia, right ear: Secondary | ICD-10-CM

## 2023-06-12 MED ORDER — AZITHROMYCIN 250 MG PO TABS: ORAL_TABLET | ORAL | 0 refills | Status: AC

## 2023-06-12 MED ORDER — BENZONATATE 100 MG PO CAPS: 100.0000 mg | ORAL_CAPSULE | Freq: Three times a day (TID) | ORAL | 0 refills | Status: AC

## 2023-06-12 NOTE — ED Provider Notes (Signed)
 [name]    CSN: [id number] Arrival date & time: 06/12/23  1414      History   Chief Complaint Chief Complaint  Patient presents with   Ear Fullness    HPI Rhonda Hart is a 78 y.o. female.   78 year old female who presents urgent care with complaints of right ear pain, decreased hearing and cough.  Her symptoms started about a week ago.  She reports that the cough has been significant at times.  She reports that the ear pain and decreased hearing started shortly after.  She feels like her ear is full and is having pain even behind the ear.  Wednesday she was running a fever of 101.  She has continued to have a low-grade fever.  She denies any chest pain, shortness of breath, nausea, vomiting, congestion, sore throat.  She is also having headaches especially on the side where the ear is bothering her.   Ear Fullness Associated symptoms include headaches. Pertinent negatives include no chest pain, no abdominal pain and no shortness of breath.    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    Past Surgical History:  Procedure Laterality Date   ABDOMINAL HYSTERECTOMY     CESAREAN SECTION     colonosocpy     JOINT REPLACEMENT     TOTAL HIP ARTHROPLASTY     x 2 on right and x 1 on left   TOTAL HIP REVISION Left 11/30/2014   Procedure: TOTAL LEFT HIP REVISION;  Surgeon: Marlena Sima, MD;  Location: [facility];  Service:  Orthopedics;  Laterality: Left;   TOTAL KNEE ARTHROPLASTY Right 04/19/2015   Procedure: RIGHT TOTAL KNEE ARTHROPLASTY;  Surgeon: Marlena Sima, MD;  Location: [facility];  Service: Orthopedics;  Laterality: Right;   TOTAL KNEE ARTHROPLASTY Left 07/29/2021   Procedure: TOTAL KNEE ARTHROPLASTY;  Surgeon: Claiborne Crew, MD;  Location: [facility];  Service: Orthopedics;  Laterality: Left;   TOTAL SHOULDER ARTHROPLASTY Left     OB History   No obstetric history on file.      Home Medications    Prior to Admission medications   Medication Sig Start Date End Date Taking? Authorizing Provider  acetaminophen  (TYLENOL ) 325 MG tablet Take 3 tablets (975 mg total) by mouth every 6 (six) hours. 07/30/21  Yes Earnie Gola, PA-C  albuterol  (PROVENTIL  HFA;VENTOLIN  HFA) 108 (90 Base) MCG/ACT inhaler Inhale 1-2 puffs into the lungs every 6 (six) hours as needed for up to 7 days for wheezing or shortness of breath. 01/31/17 06/12/23 Yes Wieters, Hallie C, PA-C  amLODipine  (NORVASC ) 10 MG tablet Take 10 mg by mouth daily.   Yes [provider]  azithromycin (ZITHROMAX) 250 MG tablet Take first 2 tablets together, then 1 every day until finished. 06/12/23  Yes Omer Monter A, PA-C  benzonatate (TESSALON) 100 MG capsule Take 1 capsule (100  mg total) by mouth every 8 (eight) hours. 06/12/23  Yes Din Bookwalter A, PA-C  losartan -hydrochlorothiazide  (HYZAAR) 100-25 MG tablet Take 1 tablet by mouth daily. 02/05/15  Yes [provider]  metoprolol  succinate (TOPROL -XL) 50 MG 24 hr tablet Take 50 mg by mouth daily. 03/10/13  Yes [provider]  pantoprazole  (PROTONIX ) 40 MG tablet Take 40 mg by mouth daily. 05/07/21  Yes [provider]  Vitamin D3 (VITAMIN D) 25 MCG tablet Take 1,000 Units by mouth daily.   Yes [provider]  celecoxib  (CELEBREX ) 200 MG capsule Take 1 capsule (200 mg total) by mouth 2 (two) times daily. 07/30/21   Earnie Gola, PA-C  citalopram  (CELEXA )  10 MG tablet Take 10 mg by mouth daily as needed for depression. 05/28/21   [provider]  diclofenac Sodium (VOLTAREN) 1 % GEL Apply 1 Application topically daily as needed for pain.    [provider]  docusate sodium  (COLACE) 100 MG capsule Take 1 capsule (100 mg total) by mouth 2 (two) times daily. 07/30/21   Earnie Gola, PA-C  hydrOXYzine (VISTARIL) 50 MG capsule Take 50 mg by mouth 3 (three) times daily as needed.    [provider]  loratadine  (CLARITIN ) 10 MG tablet Take 10 mg by mouth daily as needed for allergies. 05/07/21   [provider]  methocarbamol  (ROBAXIN ) 500 MG tablet Take 1 tablet (500 mg total) by mouth every 6 (six) hours as needed for muscle spasms. 07/30/21   Earnie Gola, PA-C  oxyCODONE  (OXY IR/ROXICODONE ) 5 MG immediate release tablet Take 1 tablet (5 mg total) by mouth every 4 (four) hours as needed for severe pain (pain score 4-6). Patient not taking: Reported on 06/12/2023 07/30/21   Earnie Gola, PA-C  polyethylene glycol (MIRALAX  / GLYCOLAX ) 17 g packet Take 17 g by mouth daily as needed for mild constipation. 07/30/21   Earnie Gola, PA-C  zinc gluconate 50 MG tablet Take 50 mg by mouth daily as needed (when feel sluggish).    [provider]    Family History History reviewed. No pertinent family history.  Social History Social History   Tobacco Use   Smoking status: Never   Smokeless tobacco: Never  Vaping Use   Vaping status: Never Used  Substance Use Topics   Alcohol  use: No   Drug use: No     Allergies   Coumadin [warfarin sodium]   Review of Systems Review of Systems  Constitutional:  Positive for fever. Negative for chills.  HENT:  Positive for ear pain and hearing loss. Negative for sore throat.   Eyes:  Negative for pain and visual disturbance.  Respiratory:  Positive for cough. Negative for shortness of breath.   Cardiovascular:  Negative for chest pain and palpitations.   Gastrointestinal:  Negative for abdominal pain and vomiting.  Genitourinary:  Negative for dysuria and hematuria.  Musculoskeletal:  Negative for arthralgias and back pain.  Skin:  Negative for color change and rash.  Neurological:  Positive for headaches. Negative for seizures and syncope.  All other systems reviewed and are negative.    Physical Exam Triage Vital Signs ED Triage Vitals  Encounter Vitals Group     BP 06/12/23 1456 129/71     Systolic BP Percentile --      Diastolic BP Percentile --      Pulse Rate 06/12/23 1456 77     Resp 06/12/23 1456 18     Temp 06/12/23 1456  99.8 F (37.7 C)     Temp Source 06/12/23 1456 Oral     SpO2 06/12/23 1456 98 %     Weight --      Height --      Head Circumference --      Peak Flow --      Pain Score 06/12/23 1451 8     Pain Loc --      Pain Education --      Exclude from Growth Chart --    No data found.  Updated Vital Signs BP 129/71 (BP Location: Left Arm)   Pulse 77   Temp 99.8 F (37.7 C) (Oral)   Resp 18   SpO2 98%   Visual Acuity Right Eye Distance:   Left Eye Distance:   Bilateral Distance:    Right Eye Near:   Left Eye Near:    Bilateral Near:     Physical Exam Vitals and nursing note reviewed.  Constitutional:      General: She is not in acute distress.    Appearance: She is well-developed.  HENT:     Head: Normocephalic and atraumatic.     Right Ear: There is impacted cerumen.     Left Ear: There is impacted cerumen.     Nose: Nose normal.     Mouth/Throat:     Mouth: Mucous membranes are moist.  Eyes:     Conjunctiva/sclera: Conjunctivae normal.  Cardiovascular:     Rate and Rhythm: Normal rate and regular rhythm.     Heart sounds: No murmur heard. Pulmonary:     Effort: Pulmonary effort is normal. No respiratory distress.     Breath sounds: Examination of the right-upper field reveals wheezing. Examination of the left-upper field reveals wheezing. Examination of the right-middle field  reveals wheezing. Examination of the left-middle field reveals wheezing. Examination of the right-lower field reveals wheezing. Wheezing present. No decreased breath sounds or rhonchi.     Comments: Faint expiratory wheezing heard throughout Abdominal:     Palpations: Abdomen is soft.     Tenderness: There is no abdominal tenderness.  Musculoskeletal:        General: No swelling.     Cervical back: Neck supple.  Skin:    General: Skin is warm and dry.     Capillary Refill: Capillary refill takes less than 2 seconds.  Neurological:     Mental Status: She is alert.     Cranial Nerves: Cranial nerves 2-12 are intact. No facial asymmetry.     Motor: No weakness.     Coordination: Coordination normal.     Gait: Gait normal.  Psychiatric:        Mood and Affect: Mood normal.     UC Treatments / Results  Labs (all labs ordered are listed, but only abnormal results are displayed) Labs Reviewed - No data to display  EKG   Radiology No results found.  Procedures Procedures (including critical care time)  Medications Ordered in UC Medications - No data to display  Initial Impression / Assessment and Plan / UC Course  I have reviewed the triage vital signs and the nursing notes.  Pertinent labs & imaging results that were available during my care of the patient were reviewed by me and considered in my medical decision making (see chart for details).     Acute cough  Lower respiratory infection  Bilateral impacted cerumen  Right ear pain  Physical exam findings showed bilateral impacted cerumen (earwax).  Ear  irrigation done today and tympanic membranes (eardrums) are clear after irrigation.  Ear pain and decreased hearing is most likely associated with this.  Also found on physical exam was wheezing bilateral and given the cough along with fever and headaches this is most likely secondary to a lower respiratory infection.  We will treat this with antibiotics and medication  for cough.  We will treat with the following: Azithromycin 250mg  Take 2 tablets today and the 1 tablet daily for 4 more days. Benzonatate (tessalon) 100 mg every 8 hours as needed for cough.   Rest and stay hydrated.  Drink plenty of fluids even if you do not feel like taking in solid foods Can try over-the-counter Debrox to help with earwax buildup. Return to urgent care or PCP if symptoms worsen or fail to resolve.     Final Clinical Impressions(s) / UC Diagnoses   Final diagnoses:  Acute cough  Lower respiratory infection  Bilateral impacted cerumen  Right ear pain     Discharge Instructions      Physical exam findings showed bilateral impacted cerumen (earwax).  Ear irrigation done today and tympanic membranes (eardrums) are clear after irrigation.  Ear pain and decreased hearing is most likely associated with this.  Also found on physical exam was wheezing bilateral and given the cough along with fever and headaches this is most likely secondary to a lower respiratory infection.  We will treat this with antibiotics and medication for cough.  We will treat with the following: Azithromycin 250mg  Take 2 tablets today and the 1 tablet daily for 4 more days. Benzonatate (tessalon) 100 mg every 8 hours as needed for cough.   Rest and stay hydrated.  Drink plenty of fluids even if you do not feel like taking in solid foods Can try over-the-counter Debrox to help with earwax buildup. Return to urgent care or PCP if symptoms worsen or fail to resolve.     ED Prescriptions     Medication Sig Dispense Auth. Provider   azithromycin (ZITHROMAX) 250 MG tablet Take first 2 tablets together, then 1 every day until finished. 6 tablet Lorenzo Romberg A, PA-C   benzonatate (TESSALON) 100 MG capsule Take 1 capsule (100 mg total) by mouth every 8 (eight) hours. 21 capsule Kreg Pesa, [place]      PDMP not reviewed this encounter.   Kreg Pesa, PA-C 06/12/23 [phone]

## 2023-06-12 NOTE — Discharge Instructions (Addendum)
 Physical exam findings showed bilateral impacted cerumen (earwax).  Ear irrigation done today and tympanic membranes (eardrums) are clear after irrigation.  Ear pain and decreased hearing is most likely associated with this.  Also found on physical exam was wheezing bilateral and given the cough along with fever and headaches this is most likely secondary to a lower respiratory infection.  We will treat this with antibiotics and medication for cough.  We will treat with the following: Azithromycin 250mg  Take 2 tablets today and the 1 tablet daily for 4 more days. Benzonatate (tessalon) 100 mg every 8 hours as needed for cough.   Rest and stay hydrated.  Drink plenty of fluids even if you do not feel like taking in solid foods Can try over-the-counter Debrox to help with earwax buildup. Return to urgent care or PCP if symptoms worsen or fail to resolve.

## 2023-06-12 NOTE — ED Triage Notes (Signed)
 Right ear issue x 1 week. "I Can't hear out out of it". Pain behind ear also and "I cough a lot"
